# Patient Record
Sex: Female | Born: 1945
Health system: Southern US, Community
[De-identification: ages and names within clinical notes are randomized; demographics above are authoritative.]

## PROBLEM LIST (undated history)

## (undated) DIAGNOSIS — H409 Unspecified glaucoma: Secondary | ICD-10-CM

## (undated) DIAGNOSIS — I728 Aneurysm of other specified arteries: Secondary | ICD-10-CM

## (undated) HISTORY — PX: ABDOMINAL HYSTERECTOMY: SHX81

## (undated) HISTORY — DX: Unspecified glaucoma: H40.9

## (undated) HISTORY — PX: EYE SURGERY: SHX253

---

## 1999-05-09 ENCOUNTER — Encounter: Payer: Self-pay | Admitting: Family Medicine

## 1999-05-09 ENCOUNTER — Encounter: Admission: RE | Admit: 1999-05-09 | Discharge: 1999-05-09 | Payer: Self-pay | Admitting: Family Medicine

## 1999-09-26 ENCOUNTER — Other Ambulatory Visit: Admission: RE | Admit: 1999-09-26 | Discharge: 1999-09-26 | Payer: Self-pay | Admitting: Family Medicine

## 2003-07-23 ENCOUNTER — Other Ambulatory Visit: Admission: RE | Admit: 2003-07-23 | Discharge: 2003-07-23 | Payer: Self-pay | Admitting: Family Medicine

## 2003-07-28 ENCOUNTER — Ambulatory Visit (HOSPITAL_COMMUNITY): Admission: RE | Admit: 2003-07-28 | Discharge: 2003-07-28 | Payer: Self-pay | Admitting: Family Medicine

## 2003-07-30 ENCOUNTER — Encounter: Admission: RE | Admit: 2003-07-30 | Discharge: 2003-07-30 | Payer: Self-pay | Admitting: Family Medicine

## 2003-09-28 ENCOUNTER — Ambulatory Visit (HOSPITAL_COMMUNITY): Admission: RE | Admit: 2003-09-28 | Discharge: 2003-09-28 | Payer: Self-pay | Admitting: Gastroenterology

## 2006-02-23 ENCOUNTER — Ambulatory Visit (HOSPITAL_COMMUNITY): Admission: RE | Admit: 2006-02-23 | Discharge: 2006-02-23 | Payer: Self-pay | Admitting: Family Medicine

## 2007-03-20 ENCOUNTER — Ambulatory Visit (HOSPITAL_COMMUNITY): Admission: RE | Admit: 2007-03-20 | Discharge: 2007-03-20 | Payer: Self-pay | Admitting: Family Medicine

## 2007-03-25 ENCOUNTER — Encounter: Admission: RE | Admit: 2007-03-25 | Discharge: 2007-03-25 | Payer: Self-pay | Admitting: Family Medicine

## 2008-06-26 ENCOUNTER — Encounter: Admission: RE | Admit: 2008-06-26 | Discharge: 2008-06-26 | Payer: Self-pay | Admitting: Family Medicine

## 2010-02-06 ENCOUNTER — Encounter: Payer: Self-pay | Admitting: Family Medicine

## 2010-06-03 NOTE — Op Note (Signed)
NAME:  Carrie Moody, BURROWES                          ACCOUNT NO.:  [id number]   MEDICAL RECORD NO.:  [id number]                   PATIENT TYPE:  AMB   LOCATION:  ENDO                                 FACILITY:  [facility]   PHYSICIAN:  John C. Madilyn Fireman, M.D.                 DATE OF BIRTH:  Feb 18, 1945   DATE OF PROCEDURE:  09/28/2003  DATE OF DISCHARGE:                                 OPERATIVE REPORT   PROCEDURE:  Colonoscopy.   INDICATION FOR PROCEDURE:  Heme-positive stool.   DESCRIPTION OF PROCEDURE:  The patient was placed in the left lateral  decubitus position and placed on the pulse monitor with continuous low-flow  oxygen delivered by nasal cannula.  She was sedated with 100 mcg IV fentanyl  and 8 mg IV Versed.  The Olympus video colonoscope was inserted into the  rectum and advanced to the cecum, confirmed by transillumination at  McBurney's point and visualization of the ileocecal valve and appendiceal  orifice.  The prep was good.  The cecum, ascending, and transverse colon all  appeared normal with no masses, polyps, diverticula, or other mucosal  abnormalities.  Within the descending and sigmoid were a few scattered  diverticula, no other abnormalities.  The rectum appeared normal, and  retroflexed view of the anus revealed no obvious internal hemorrhoids.  The  scope was then withdrawn and the patient returned to the recovery room in  stable condition.  She tolerated the procedure well, and there were no  immediate complications.   IMPRESSION:  A few diverticula, otherwise normal study.   PLAN:  Next colon screening by sigmoidoscopy in 5 years.                                               John C. Madilyn Fireman, M.D.    J[facility]/MEDQ  D:  09/28/2003  T:  09/28/2003  Job:  [id number]   cc:   Pam Drown, M.D.  [address]  [place]  [place] [address]  Fax: [phone]

## 2011-12-06 ENCOUNTER — Other Ambulatory Visit: Payer: Self-pay | Admitting: Family Medicine

## 2011-12-06 DIAGNOSIS — M542 Cervicalgia: Secondary | ICD-10-CM

## 2011-12-06 DIAGNOSIS — H9311 Tinnitus, right ear: Secondary | ICD-10-CM

## 2011-12-18 ENCOUNTER — Ambulatory Visit
Admission: RE | Admit: 2011-12-18 | Discharge: 2011-12-18 | Disposition: A | Discharge status: Home / Self Care | Payer: BC Managed Care – PPO | Source: Ambulatory Visit | Attending: Family Medicine | Admitting: Family Medicine

## 2011-12-18 DIAGNOSIS — H9311 Tinnitus, right ear: Secondary | ICD-10-CM

## 2011-12-18 DIAGNOSIS — M542 Cervicalgia: Secondary | ICD-10-CM

## 2011-12-18 MED ORDER — GADOBENATE DIMEGLUMINE 529 MG/ML IV SOLN
9.0000 mL | Freq: Once | INTRAVENOUS | Status: AC | PRN
  Administered 2011-12-18: 9 mL via INTRAVENOUS

## 2012-05-29 ENCOUNTER — Other Ambulatory Visit: Payer: Self-pay | Admitting: Family Medicine

## 2012-05-29 DIAGNOSIS — Z1231 Encounter for screening mammogram for malignant neoplasm of breast: Secondary | ICD-10-CM

## 2012-07-17 ENCOUNTER — Ambulatory Visit: Payer: BC Managed Care – PPO

## 2012-07-18 ENCOUNTER — Ambulatory Visit: Payer: BC Managed Care – PPO

## 2012-08-01 ENCOUNTER — Ambulatory Visit: Payer: BC Managed Care – PPO

## 2012-08-19 ENCOUNTER — Ambulatory Visit: Payer: BC Managed Care – PPO

## 2012-09-23 ENCOUNTER — Ambulatory Visit
Admission: RE | Admit: 2012-09-23 | Discharge: 2012-09-23 | Disposition: A | Discharge status: Home / Self Care | Payer: Medicare Other | Source: Ambulatory Visit | Attending: Family Medicine | Admitting: Family Medicine

## 2012-09-23 DIAGNOSIS — Z1231 Encounter for screening mammogram for malignant neoplasm of breast: Secondary | ICD-10-CM

## 2013-06-25 ENCOUNTER — Other Ambulatory Visit: Payer: Self-pay | Admitting: Otolaryngology

## 2013-06-25 DIAGNOSIS — R0989 Other specified symptoms and signs involving the circulatory and respiratory systems: Secondary | ICD-10-CM

## 2013-06-25 DIAGNOSIS — R09A2 Foreign body sensation, throat: Secondary | ICD-10-CM

## 2013-06-27 ENCOUNTER — Ambulatory Visit
Admission: RE | Admit: 2013-06-27 | Discharge: 2013-06-27 | Disposition: A | Discharge status: Home / Self Care | Payer: Commercial Managed Care - HMO | Source: Ambulatory Visit | Attending: Otolaryngology | Admitting: Otolaryngology

## 2013-06-27 ENCOUNTER — Encounter (INDEPENDENT_AMBULATORY_CARE_PROVIDER_SITE_OTHER): Payer: Self-pay

## 2013-06-27 DIAGNOSIS — R0989 Other specified symptoms and signs involving the circulatory and respiratory systems: Secondary | ICD-10-CM

## 2013-06-27 DIAGNOSIS — R09A2 Foreign body sensation, throat: Secondary | ICD-10-CM

## 2014-01-23 DIAGNOSIS — L723 Sebaceous cyst: Secondary | ICD-10-CM | POA: Diagnosis not present

## 2014-01-26 DIAGNOSIS — H02834 Dermatochalasis of left upper eyelid: Secondary | ICD-10-CM | POA: Diagnosis not present

## 2014-01-26 DIAGNOSIS — H40033 Anatomical narrow angle, bilateral: Secondary | ICD-10-CM | POA: Diagnosis not present

## 2014-01-26 DIAGNOSIS — H02831 Dermatochalasis of right upper eyelid: Secondary | ICD-10-CM | POA: Diagnosis not present

## 2014-02-03 DIAGNOSIS — H40033 Anatomical narrow angle, bilateral: Secondary | ICD-10-CM | POA: Diagnosis not present

## 2014-02-24 DIAGNOSIS — H40039 Anatomical narrow angle, unspecified eye: Secondary | ICD-10-CM | POA: Diagnosis not present

## 2014-03-18 DIAGNOSIS — H02834 Dermatochalasis of left upper eyelid: Secondary | ICD-10-CM | POA: Diagnosis not present

## 2014-03-18 DIAGNOSIS — H40033 Anatomical narrow angle, bilateral: Secondary | ICD-10-CM | POA: Diagnosis not present

## 2014-03-18 DIAGNOSIS — H02831 Dermatochalasis of right upper eyelid: Secondary | ICD-10-CM | POA: Diagnosis not present

## 2014-03-18 DIAGNOSIS — H40053 Ocular hypertension, bilateral: Secondary | ICD-10-CM | POA: Diagnosis not present

## 2014-03-18 DIAGNOSIS — H2513 Age-related nuclear cataract, bilateral: Secondary | ICD-10-CM | POA: Diagnosis not present

## 2014-07-03 DIAGNOSIS — H2513 Age-related nuclear cataract, bilateral: Secondary | ICD-10-CM | POA: Diagnosis not present

## 2014-07-03 DIAGNOSIS — H02834 Dermatochalasis of left upper eyelid: Secondary | ICD-10-CM | POA: Diagnosis not present

## 2014-07-03 DIAGNOSIS — H02831 Dermatochalasis of right upper eyelid: Secondary | ICD-10-CM | POA: Diagnosis not present

## 2014-07-03 DIAGNOSIS — H40033 Anatomical narrow angle, bilateral: Secondary | ICD-10-CM | POA: Diagnosis not present

## 2014-07-03 DIAGNOSIS — H40053 Ocular hypertension, bilateral: Secondary | ICD-10-CM | POA: Diagnosis not present

## 2014-07-14 DIAGNOSIS — G479 Sleep disorder, unspecified: Secondary | ICD-10-CM | POA: Diagnosis not present

## 2014-07-14 DIAGNOSIS — E559 Vitamin D deficiency, unspecified: Secondary | ICD-10-CM | POA: Diagnosis not present

## 2014-07-14 DIAGNOSIS — E785 Hyperlipidemia, unspecified: Secondary | ICD-10-CM | POA: Diagnosis not present

## 2014-07-14 DIAGNOSIS — H02409 Unspecified ptosis of unspecified eyelid: Secondary | ICD-10-CM | POA: Diagnosis not present

## 2014-07-14 DIAGNOSIS — F331 Major depressive disorder, recurrent, moderate: Secondary | ICD-10-CM | POA: Diagnosis not present

## 2014-07-14 DIAGNOSIS — Z0000 Encounter for general adult medical examination without abnormal findings: Secondary | ICD-10-CM | POA: Diagnosis not present

## 2014-07-14 DIAGNOSIS — M85852 Other specified disorders of bone density and structure, left thigh: Secondary | ICD-10-CM | POA: Diagnosis not present

## 2014-07-14 DIAGNOSIS — H40009 Preglaucoma, unspecified, unspecified eye: Secondary | ICD-10-CM | POA: Diagnosis not present

## 2014-08-19 DIAGNOSIS — Z719 Counseling, unspecified: Secondary | ICD-10-CM | POA: Diagnosis not present

## 2014-08-19 DIAGNOSIS — H02836 Dermatochalasis of left eye, unspecified eyelid: Secondary | ICD-10-CM | POA: Diagnosis not present

## 2014-08-19 DIAGNOSIS — H02833 Dermatochalasis of right eye, unspecified eyelid: Secondary | ICD-10-CM | POA: Diagnosis not present

## 2014-10-28 DIAGNOSIS — L821 Other seborrheic keratosis: Secondary | ICD-10-CM | POA: Diagnosis not present

## 2014-10-28 DIAGNOSIS — D1801 Hemangioma of skin and subcutaneous tissue: Secondary | ICD-10-CM | POA: Diagnosis not present

## 2014-10-28 DIAGNOSIS — D225 Melanocytic nevi of trunk: Secondary | ICD-10-CM | POA: Diagnosis not present

## 2014-10-28 DIAGNOSIS — D2261 Melanocytic nevi of right upper limb, including shoulder: Secondary | ICD-10-CM | POA: Diagnosis not present

## 2015-01-17 HISTORY — PX: IRIDOTOMY / IRIDECTOMY: SHX165

## 2015-05-11 DIAGNOSIS — H02834 Dermatochalasis of left upper eyelid: Secondary | ICD-10-CM | POA: Diagnosis not present

## 2015-05-11 DIAGNOSIS — H40053 Ocular hypertension, bilateral: Secondary | ICD-10-CM | POA: Diagnosis not present

## 2015-05-11 DIAGNOSIS — H02831 Dermatochalasis of right upper eyelid: Secondary | ICD-10-CM | POA: Diagnosis not present

## 2015-05-11 DIAGNOSIS — H40033 Anatomical narrow angle, bilateral: Secondary | ICD-10-CM | POA: Diagnosis not present

## 2015-05-11 DIAGNOSIS — H2513 Age-related nuclear cataract, bilateral: Secondary | ICD-10-CM | POA: Diagnosis not present

## 2015-05-13 ENCOUNTER — Other Ambulatory Visit: Payer: Self-pay

## 2015-05-13 DIAGNOSIS — Z1231 Encounter for screening mammogram for malignant neoplasm of breast: Secondary | ICD-10-CM

## 2015-06-23 ENCOUNTER — Ambulatory Visit
Admission: RE | Admit: 2015-06-23 | Discharge: 2015-06-23 | Disposition: A | Discharge status: Home / Self Care | Payer: Commercial Managed Care - HMO | Source: Ambulatory Visit

## 2015-06-23 ENCOUNTER — Other Ambulatory Visit: Payer: Self-pay | Admitting: Family Medicine

## 2015-06-23 DIAGNOSIS — Z1231 Encounter for screening mammogram for malignant neoplasm of breast: Secondary | ICD-10-CM

## 2015-07-12 DIAGNOSIS — H02831 Dermatochalasis of right upper eyelid: Secondary | ICD-10-CM | POA: Diagnosis not present

## 2015-07-12 DIAGNOSIS — H40053 Ocular hypertension, bilateral: Secondary | ICD-10-CM | POA: Diagnosis not present

## 2015-07-12 DIAGNOSIS — H40033 Anatomical narrow angle, bilateral: Secondary | ICD-10-CM | POA: Diagnosis not present

## 2015-07-12 DIAGNOSIS — H02834 Dermatochalasis of left upper eyelid: Secondary | ICD-10-CM | POA: Diagnosis not present

## 2015-07-12 DIAGNOSIS — H2513 Age-related nuclear cataract, bilateral: Secondary | ICD-10-CM | POA: Diagnosis not present

## 2015-07-13 DIAGNOSIS — Z1211 Encounter for screening for malignant neoplasm of colon: Secondary | ICD-10-CM | POA: Diagnosis not present

## 2015-07-13 DIAGNOSIS — K5730 Diverticulosis of large intestine without perforation or abscess without bleeding: Secondary | ICD-10-CM | POA: Diagnosis not present

## 2015-07-29 ENCOUNTER — Ambulatory Visit
Admission: RE | Admit: 2015-07-29 | Discharge: 2015-07-29 | Disposition: A | Discharge status: Home / Self Care | Payer: Commercial Managed Care - HMO | Source: Ambulatory Visit | Attending: Gastroenterology | Admitting: Gastroenterology

## 2015-07-29 ENCOUNTER — Other Ambulatory Visit: Payer: Self-pay | Admitting: Gastroenterology

## 2015-07-29 DIAGNOSIS — R1032 Left lower quadrant pain: Secondary | ICD-10-CM

## 2015-07-29 DIAGNOSIS — E785 Hyperlipidemia, unspecified: Secondary | ICD-10-CM | POA: Diagnosis not present

## 2015-07-29 DIAGNOSIS — M85852 Other specified disorders of bone density and structure, left thigh: Secondary | ICD-10-CM | POA: Diagnosis not present

## 2015-07-29 DIAGNOSIS — G479 Sleep disorder, unspecified: Secondary | ICD-10-CM | POA: Diagnosis not present

## 2015-07-29 DIAGNOSIS — E559 Vitamin D deficiency, unspecified: Secondary | ICD-10-CM | POA: Diagnosis not present

## 2015-07-29 DIAGNOSIS — Z1389 Encounter for screening for other disorder: Secondary | ICD-10-CM | POA: Diagnosis not present

## 2015-07-29 DIAGNOSIS — Z0001 Encounter for general adult medical examination with abnormal findings: Secondary | ICD-10-CM | POA: Diagnosis not present

## 2015-07-29 DIAGNOSIS — K5730 Diverticulosis of large intestine without perforation or abscess without bleeding: Secondary | ICD-10-CM | POA: Diagnosis not present

## 2015-07-29 MED ORDER — IOPAMIDOL (ISOVUE-300) INJECTION 61%
100.0000 mL | Freq: Once | INTRAVENOUS | Status: AC | PRN
  Administered 2015-07-29: 100 mL via INTRAVENOUS

## 2015-07-30 ENCOUNTER — Other Ambulatory Visit: Payer: Commercial Managed Care - HMO

## 2015-08-03 DIAGNOSIS — R1032 Left lower quadrant pain: Secondary | ICD-10-CM | POA: Diagnosis not present

## 2015-08-03 DIAGNOSIS — I728 Aneurysm of other specified arteries: Secondary | ICD-10-CM | POA: Diagnosis not present

## 2015-08-17 ENCOUNTER — Encounter (HOSPITAL_COMMUNITY): Payer: Self-pay | Admitting: Emergency Medicine

## 2015-08-17 ENCOUNTER — Emergency Department (HOSPITAL_COMMUNITY): Payer: Commercial Managed Care - HMO

## 2015-08-17 ENCOUNTER — Emergency Department (HOSPITAL_COMMUNITY)
Admission: EM | Admit: 2015-08-17 | Discharge: 2015-08-17 | Disposition: A | Discharge status: Home / Self Care | Payer: Commercial Managed Care - HMO | Attending: Emergency Medicine | Admitting: Emergency Medicine

## 2015-08-17 DIAGNOSIS — R509 Fever, unspecified: Secondary | ICD-10-CM | POA: Diagnosis not present

## 2015-08-17 DIAGNOSIS — M549 Dorsalgia, unspecified: Secondary | ICD-10-CM | POA: Insufficient documentation

## 2015-08-17 DIAGNOSIS — Z79899 Other long term (current) drug therapy: Secondary | ICD-10-CM | POA: Diagnosis not present

## 2015-08-17 DIAGNOSIS — R1032 Left lower quadrant pain: Secondary | ICD-10-CM | POA: Diagnosis not present

## 2015-08-17 DIAGNOSIS — R112 Nausea with vomiting, unspecified: Secondary | ICD-10-CM | POA: Diagnosis not present

## 2015-08-17 DIAGNOSIS — K5732 Diverticulitis of large intestine without perforation or abscess without bleeding: Secondary | ICD-10-CM | POA: Diagnosis not present

## 2015-08-17 HISTORY — DX: Aneurysm of other specified arteries: I72.8

## 2015-08-17 LAB — COMPREHENSIVE METABOLIC PANEL
ALBUMIN: 4.6 g/dL (ref 3.5–5.0)
ALT: 32 U/L (ref 14–54)
ANION GAP: 9 (ref 5–15)
AST: 38 U/L (ref 15–41)
Alkaline Phosphatase: 65 U/L (ref 38–126)
BILIRUBIN TOTAL: 1.1 mg/dL (ref 0.3–1.2)
BUN: 8 mg/dL (ref 6–20)
CO2: 23 mmol/L (ref 22–32)
Calcium: 9 mg/dL (ref 8.9–10.3)
Chloride: 104 mmol/L (ref 101–111)
Creatinine, Ser: 0.73 mg/dL (ref 0.44–1.00)
GFR calc non Af Amer: >60 mL/min (ref >60)
GLUCOSE: 133 mg/dL — AB (ref 65–99)
POTASSIUM: 3.7 mmol/L (ref 3.5–5.1)
SODIUM: 136 mmol/L (ref 135–145)
TOTAL PROTEIN: 7.5 g/dL (ref 6.5–8.1)

## 2015-08-17 LAB — CBC
HEMATOCRIT: 40.8 % (ref 36.0–46.0)
HEMOGLOBIN: 13.6 g/dL (ref 12.0–15.0)
MCH: 29.6 pg (ref 26.0–34.0)
MCHC: 33.3 g/dL (ref 30.0–36.0)
MCV: 88.7 fL (ref 78.0–100.0)
Platelets: 215 10*3/uL (ref 150–400)
RBC: 4.6 MIL/uL (ref 3.87–5.11)
RDW: 12.9 % (ref 11.5–15.5)
WBC: 11.1 10*3/uL — ABNORMAL HIGH (ref 4.0–10.5)

## 2015-08-17 LAB — I-STAT CG4 LACTIC ACID, ED: LACTIC ACID, VENOUS: 1.32 mmol/L (ref 0.5–1.9)

## 2015-08-17 LAB — URINALYSIS, ROUTINE W REFLEX MICROSCOPIC
BILIRUBIN URINE: NEGATIVE
Glucose, UA: NEGATIVE mg/dL
Ketones, ur: NEGATIVE mg/dL
Leukocytes, UA: NEGATIVE
NITRITE: NEGATIVE
PH: 5 (ref 5.0–8.0)
Protein, ur: NEGATIVE mg/dL
SPECIFIC GRAVITY, URINE: 1.012 (ref 1.005–1.030)

## 2015-08-17 LAB — URINE MICROSCOPIC-ADD ON

## 2015-08-17 LAB — LIPASE, BLOOD: Lipase: 46 U/L (ref 11–51)

## 2015-08-17 MED ORDER — IOPAMIDOL (ISOVUE-300) INJECTION 61%
100.0000 mL | Freq: Once | INTRAVENOUS | Status: AC | PRN
  Administered 2015-08-17: 100 mL via INTRAVENOUS

## 2015-08-17 MED ORDER — DIATRIZOATE MEGLUMINE & SODIUM 66-10 % PO SOLN
15.0000 mL | Freq: Once | ORAL | Status: AC
  Administered 2015-08-17: 15 mL via ORAL

## 2015-08-17 MED ORDER — ACETAMINOPHEN 325 MG PO TABS
650.0000 mg | ORAL_TABLET | Freq: Once | ORAL | Status: AC
  Administered 2015-08-17: 650 mg via ORAL
  Filled 2015-08-17: qty 2

## 2015-08-17 MED ORDER — SODIUM CHLORIDE 0.9 % IV BOLUS (SEPSIS)
1000.0000 mL | Freq: Once | INTRAVENOUS | Status: AC
  Administered 2015-08-17: 1000 mL via INTRAVENOUS

## 2015-08-17 MED ORDER — ACETAMINOPHEN 500 MG PO TABS
1000.0000 mg | ORAL_TABLET | Freq: Once | ORAL | Status: AC
  Administered 2015-08-17: 1000 mg via ORAL
  Filled 2015-08-17: qty 2

## 2015-08-17 NOTE — ED Notes (Signed)
PT DISCHARGED. INSTRUCTIONS GIVEN. AAOX4. PT IN NO APPARENT DISTRESS OR PAIN. THE OPPORTUNITY TO ASK QUESTIONS WAS PROVIDED. 

## 2015-08-17 NOTE — ED Triage Notes (Addendum)
patient c/o pain in abd and back that has been going on. But today started having n/v, chills, shaking and fever.  Patient states that in July found splenic aorta aneurysm.  Patient denies taking anything for fever today.  Pt recently on 3 antibiotics for diverticulitis.

## 2015-08-17 NOTE — ED Provider Notes (Signed)
[facility] DEPT Provider Note   CSN: [id number] Arrival date & time: 08/17/15  1418  First Provider Contact:  None       History   Chief Complaint Chief Complaint  Patient presents with  . Abdominal Pain  . Back Pain  . Emesis  . Chills  . Fever    HPI Carrie Moody is a 70 y.o. female.  Patient presents with abdominal back pain. She states she's had persistent discomfort in her left lower abdomen for about 5 weeks. She had a colonoscopy done by Dr. Amedeo Plenty.  She was told during this colonoscopy that she had evidence of diverticulitis. Apparently she had inflammation of the diverticula with purulent discharge. She took a 10 day course of Cipro and Flagyl. When she followed up with her PCP, she had some blood in her urine and ongoing abdominal pain. They were concerned about a possible perforation and she had a CT scan done on July 13th. This did not show any evidence of diverticulitis. There was diverticulosis and incidental finding of a splenic artery aneurysm. She has an appointment to follow-up with Dr. early with vascular surgery on August 15. She presents today because she had onset of severe pain in her left mid back. She also has ongoing pain in her left lower abdomen. She developed a fever to 103 with associated nausea and vomiting. She had severe chills. She denies any urinary symptoms. No hematuria. No diarrhea. No change in her bowel habits. No blood in her stool. No URI symptoms. No cough or chest congestion.    Abdominal Pain   Associated symptoms include fever, nausea and vomiting. Pertinent negatives include diarrhea, frequency, hematuria, headaches and arthralgias.  Back Pain    Emesis   Associated symptoms include vomiting. Pertinent negatives include no chest pain, no diarrhea, no congestion, no headaches and no cough.  Fever   Associated symptoms include vomiting. Pertinent negatives include no numbness and no weakness.    Past Medical History:  Diagnosis  Date  . Splenic artery aneurysm (HCC)     There are no active problems to display for this patient.   Past Surgical History:  Procedure Laterality Date  . ABDOMINAL HYSTERECTOMY      OB History    No data available       Home Medications    Prior to Admission medications   Medication Sig Start Date End Date Taking? Authorizing Provider  LUMIGAN 0.01 % SOLN Place 1 drop into both eyes at bedtime.  06/08/15  Yes Historical Provider, MD  zolpidem (AMBIEN) 10 MG tablet Take 5 mg by mouth at bedtime as needed for sleep.  07/29/15  Yes Historical Provider, MD  amoxicillin-clavulanate (AUGMENTIN) 875-125 MG tablet Take 1 tablet by mouth 2 (two) times daily. For 7 days, starting 08/04/15. 08/04/15   Historical Provider, MD  ciprofloxacin (CIPRO) 500 MG tablet Take 500 mg by mouth 2 (two) times daily. For 5 days, starting 07/29/15. 07/29/15   Historical Provider, MD  metroNIDAZOLE (FLAGYL) 500 MG tablet Take 500 mg by mouth 2 (two) times daily. For 5 days, starting 07/29/15. 07/29/15   Historical Provider, MD  Vitamin D, Ergocalciferol, (DRISDOL) 50000 units CAPS capsule Take 50,000 Units by mouth every 7 (seven) days. 07/31/15   Historical Provider, MD    Family History No family history on file.  Social History Social History  Substance Use Topics  . Smoking status: Never Smoker  . Smokeless tobacco: Never Used  . Alcohol use Yes  Comment: occasional     Allergies   Codeine   Review of Systems Review of Systems  Constitutional: Positive for fever. Negative for chills, diaphoresis and fatigue.  HENT: Negative for congestion, rhinorrhea and sneezing.   Eyes: Negative.   Respiratory: Negative for cough, chest tightness and shortness of breath.   Cardiovascular: Negative for chest pain and leg swelling.  Gastrointestinal: Positive for abdominal pain, nausea and vomiting. Negative for blood in stool and diarrhea.  Genitourinary: Negative for difficulty urinating, flank pain,  frequency and hematuria.  Musculoskeletal: Positive for back pain. Negative for arthralgias.  Skin: Negative for rash.  Neurological: Negative for dizziness, speech difficulty, weakness, numbness and headaches.     Physical Exam Updated Vital Signs BP 112/55 (BP Location: Right Arm)   Pulse 105   Temp 99.8 F (37.7 C) (Oral)   Resp 14   Ht 5\' 3"  (1.6 m)   Wt 116 lb (52.6 kg)   SpO2 100%   BMI 20.55 kg/m   Physical Exam  Constitutional: She is oriented to person, place, and time. She appears well-developed and well-nourished.  HENT:  Head: Normocephalic and atraumatic.  Eyes: Pupils are equal, round, and reactive to light.  Neck: Normal range of motion. Neck supple.  Cardiovascular: Normal rate, regular rhythm and normal heart sounds.   Pulmonary/Chest: Effort normal and breath sounds normal. No respiratory distress. She has no wheezes. She has no rales. She exhibits no tenderness.  Abdominal: Soft. Bowel sounds are normal. There is tenderness (moderate TTP LLQ, mild left CVA tenderness). There is no rebound and no guarding.  Musculoskeletal: Normal range of motion. She exhibits no edema.  Lymphadenopathy:    She has no cervical adenopathy.  Neurological: She is alert and oriented to person, place, and time.  Skin: Skin is warm and dry. No rash noted.  Psychiatric: She has a normal mood and affect.     ED Treatments / Results  Labs (all labs ordered are listed, but only abnormal results are displayed) Labs Reviewed  COMPREHENSIVE METABOLIC PANEL - Abnormal; Notable for the following:       Result Value   Glucose, Bld 133 (*)    All other components within normal limits  CBC - Abnormal; Notable for the following:    WBC 11.1 (*)    All other components within normal limits  URINALYSIS, ROUTINE W REFLEX MICROSCOPIC (NOT AT [facility]) - Abnormal; Notable for the following:    Hgb urine dipstick SMALL (*)    All other components within normal limits  URINE MICROSCOPIC-ADD ON  - Abnormal; Notable for the following:    Squamous Epithelial / LPF 0-5 (*)    Bacteria, UA RARE (*)    All other components within normal limits  CULTURE, BLOOD (ROUTINE X 2)  CULTURE, BLOOD (ROUTINE X 2)  LIPASE, BLOOD  I-STAT CG4 LACTIC ACID, ED    EKG  EKG Interpretation  Date/Time:  Tuesday August 17 2015 16:50:59 EDT Ventricular Rate:  106 PR Interval:    QRS Duration: 93 QT Interval:  328 QTC Calculation: 436 R Axis:   56 Text Interpretation:  Sinus tachycardia RSR' in V1 or V2, probably normal variant Baseline wander in lead(s) V3 No old tracing to compare Confirmed by Shikita Vaillancourt  MD, Katlynn Naser ([id number]) on 08/17/2015 6:24:45 PM       Radiology Dg Chest 2 View  Result Date: 08/17/2015 CLINICAL DATA:  Fever, abdominal pain, nausea and vomiting today. EXAM: CHEST  2 VIEW COMPARISON:  Chest x-ray dated  03/06/2010. FINDINGS: Heart and mediastinal contours are normal. Lungs are clear. No evidence of pneumonia. No pleural effusion or pneumothorax seen. Osseous structures about the chest are unremarkable. IMPRESSION: No active cardiopulmonary disease. Electronically Signed   By: Franki Cabot M.D.   On: 08/17/2015 19:35   Ct Abdomen Pelvis W Contrast  Result Date: 08/17/2015 CLINICAL DATA:  Recent diverticulitis, worsening abdominal pain and fever. Pain in the abdomen and back. Nausea, vomiting, chills, shaking, and fever. Splenic aneurysm noted in July. EXAM: CT ABDOMEN AND PELVIS WITH CONTRAST TECHNIQUE: Multidetector CT imaging of the abdomen and pelvis was performed using the standard protocol following bolus administration of intravenous contrast. CONTRAST:  142mL ISOVUE-300 IOPAMIDOL (ISOVUE-300) INJECTION 61% COMPARISON:  07/29/2015 CT FINDINGS: Lower chest: No pulmonary nodules, pleural effusions, or infiltrates. Heart size is normal. No imaged pericardial effusion or significant coronary artery calcifications. Hepatobiliary: No focal liver abnormality identified. Gallbladder is present.  Pancreas: Normal in appearance. Spleen: Normal in appearance. Stable appearance of splenic artery aneurysm, 1.7 centimeters maximum diameter. Renal/Adrenal: The adrenal glands are normal. There is symmetric enhancement and excretion from both kidneys. No focal renal mass or hydronephrosis. Gastrointestinal tract: The stomach and small bowel loops are normal in appearance. Extensive diverticular disease throughout the colon, particularly involving the sigmoid. No acute inflammatory changes are present indicate acute diverticulitis. No abscess or free intraperitoneal air. Appendix is not seen. Reproductive/Pelvis: The uterus is surgically absent. No adnexal mass. Vascular/Lymphatic: No evidence for aortic aneurysm. No retroperitoneal or mesenteric adenopathy. Musculoskeletal/Abdominal wall: Abdominal wall is unremarkable. Visualized osseous structures have a normal appearance. Other: none IMPRESSION: 1. Significant diverticulosis without evidence for acute diverticulitis. 2. No abscess or bowel obstruction. 3. Stable appearance of splenic artery aneurysm. Annual cross-sectional surveillance is recommended. Electronically Signed   By: Nolon Nations M.D.   On: 08/17/2015 17:46    Procedures Procedures (including critical care time)  Medications Ordered in ED Medications  sodium chloride 0.9 % bolus 1,000 mL (1,000 mLs Intravenous New Bag/Given 08/17/15 1939)  acetaminophen (TYLENOL) tablet 1,000 mg (not administered)  acetaminophen (TYLENOL) tablet 650 mg (650 mg Oral Given 08/17/15 1435)  sodium chloride 0.9 % bolus 1,000 mL (0 mLs Intravenous Stopped 08/17/15 1939)  iopamidol (ISOVUE-300) 61 % injection 100 mL (100 mLs Intravenous Contrast Given 08/17/15 1724)  diatrizoate meglumine-sodium (GASTROGRAFIN) 66-10 % solution 15 mL (15 mLs Oral Given 08/17/15 1630)     Initial Impression / Assessment and Plan / ED Course  I have reviewed the triage vital signs and the nursing notes.  Pertinent labs & imaging  results that were available during my care of the patient were reviewed by me and considered in my medical decision making (see chart for details).  Clinical Course    Patient presents with fever and chills. She had one episode of vomiting earlier today but no vomiting since that time. She has pain to her left lower abdomen which is been going on for 5 weeks and is unchanged. She does have some new pain in her right upper back. She was treated here with IV fluids and antipyretics. She's feeling much better. Her back pain is completely resolved. Her urine is clean without evidence of infection. There is still some microscopic hematuria. I did a CT scan of her abdomen pelvis which does not show any acute infection. Chest x-ray is clear without evidence of infection. She has no symptoms that would be more consistent with meningitis. Her lactate is normal. Her heart rate is improved from the 120s  to the low 100s. She states overall she is feeling much better. There is no other markers for sepsis. She's had no hypotension. Blood cultures are drawn. She was discharged home in good condition. She will follow-up with her PCP tomorrow if she's not feeling any better. She was advised to return here if she has any worsening symptoms. She seems to be very reliable and has transportation.  Final Clinical Impressions(s) / ED Diagnoses   Final diagnoses:  Febrile illness    New Prescriptions New Prescriptions   No medications on file     Malvin Johns, MD 08/17/15 2038

## 2015-08-21 ENCOUNTER — Telehealth (HOSPITAL_BASED_OUTPATIENT_CLINIC_OR_DEPARTMENT_OTHER): Payer: Self-pay

## 2015-08-23 LAB — CULTURE, BLOOD (ROUTINE X 2)
CULTURE: NO GROWTH
Culture: NO GROWTH

## 2015-08-25 ENCOUNTER — Encounter: Payer: Commercial Managed Care - HMO | Admitting: Vascular Surgery

## 2015-08-26 ENCOUNTER — Encounter: Payer: Self-pay | Admitting: Vascular Surgery

## 2015-08-30 DIAGNOSIS — R351 Nocturia: Secondary | ICD-10-CM | POA: Diagnosis not present

## 2015-08-30 DIAGNOSIS — R3121 Asymptomatic microscopic hematuria: Secondary | ICD-10-CM | POA: Diagnosis not present

## 2015-08-30 DIAGNOSIS — R350 Frequency of micturition: Secondary | ICD-10-CM | POA: Diagnosis not present

## 2015-08-31 ENCOUNTER — Ambulatory Visit (INDEPENDENT_AMBULATORY_CARE_PROVIDER_SITE_OTHER): Payer: Commercial Managed Care - HMO | Admitting: Vascular Surgery

## 2015-08-31 ENCOUNTER — Encounter: Payer: Self-pay | Admitting: Vascular Surgery

## 2015-08-31 VITALS — BP 119/68 | HR 87 | Ht 63.0 in | Wt 114.8 lb

## 2015-08-31 DIAGNOSIS — I728 Aneurysm of other specified arteries: Secondary | ICD-10-CM | POA: Diagnosis not present

## 2015-08-31 NOTE — Progress Notes (Signed)
Patient name: Carrie Moody MRN: [id number] DOB: 1945/08/16 Sex: female  REASON FOR CONSULT: Valuation recent diagnosis of splenic artery aneurysm  HPI: Carrie Moody is a 70 y.o. female, is today for discussion of recent discovery of a 1.8 cm splenic artery aneurysm seen on CT scan. He is here today with her husband. Initially presented in June with severe pain in her left lower quadrant. Colonoscopy revealed diverticulitis she was started on Flagyl and Cipro. She had persistent pain and underwent CT scan on 07/29/2015. This did not show any evidence of colon perforation but did show unsuspected 1.8 cm clinic artery aneurysm. She is continue to have some discomfort in her left lower quadrant and it continues to come and go. It is sometimes on a pain level of 2-3 and can be is much as 78. Does radiate around her flank towards her back on occasion. She reports still an area that is low in her left lower quadrant. Does not appear to be related to bowel movements. She had an episode August 1 of attempt 102 with more pain and more severe abdominal and back pain with vomiting and chills. She presented to emergency room on 81 and underwent repeat CT scan showing no change in the splenic artery aneurysm and no new findings. Since then she's had a workup of the XCOPY hematuria with no evidence of abnormality in her bladder. She continues to have the left lower quadrant discomfort. She does have a history of abdominal aortic aneurysm in his father. He did not have a treatment of this prior to his death. Was very healthy and very active  Past Medical History:  Diagnosis Date  . Splenic artery aneurysm (HCC)     Family History  Problem Relation Age of Onset  . AAA (abdominal aortic aneurysm) Father     SOCIAL HISTORY: Social History   Social History  . Marital status: Married    Spouse name: N/A  . Number of children: N/A  . Years of education: N/A   Occupational History  . Not on file.    Social History Main Topics  . Smoking status: Never Smoker  . Smokeless tobacco: Never Used  . Alcohol use Yes     Comment: occasional  . Drug use: Unknown  . Sexual activity: Not on file   Other Topics Concern  . Not on file   Social History Narrative  . No narrative on file    Allergies  Allergen Reactions  . Codeine Other (See Comments)    Patient doesn't recall exact reaction; remembers being "wired" after taking it.    Current Outpatient Prescriptions  Medication Sig Dispense Refill  . LUMIGAN 0.01 % SOLN Place 1 drop into both eyes at bedtime.     . Vitamin D, Ergocalciferol, (DRISDOL) 50000 units CAPS capsule Take 50,000 Units by mouth every 7 (seven) days.    Marland Kitchen zolpidem (AMBIEN) 10 MG tablet Take 5 mg by mouth at bedtime as needed for sleep.     Marland Kitchen amoxicillin-clavulanate (AUGMENTIN) 875-125 MG tablet Take 1 tablet by mouth 2 (two) times daily. For 7 days, starting 08/04/15.    . ciprofloxacin (CIPRO) 500 MG tablet Take 500 mg by mouth 2 (two) times daily. For 5 days, starting 07/29/15.    Marland Kitchen metroNIDAZOLE (FLAGYL) 500 MG tablet Take 500 mg by mouth 2 (two) times daily. For 5 days, starting 07/29/15.     No current facility-administered medications for this visit.     REVIEW OF  SYSTEMS:  [X]  denotes positive finding, [ ]  denotes negative finding Cardiac  Comments:  Chest pain or chest pressure:    Shortness of breath upon exertion:    Short of breath when lying flat:    Irregular heart rhythm:        Vascular    Pain in calf, thigh, or hip brought on by ambulation:    Pain in feet at night that wakes you up from your sleep:     Blood clot in your veins:    Leg swelling:         Pulmonary    Oxygen at home:    Productive cough:     Wheezing:         Neurologic    Sudden weakness in arms or legs:     Sudden numbness in arms or legs:     Sudden onset of difficulty speaking or slurred speech:    Temporary loss of vision in one eye:     Problems with  dizziness:         Gastrointestinal    Blood in stool:     Vomited blood:         Genitourinary    Burning when urinating:     Blood in urine: x       Psychiatric    Major depression:         Hematologic    Bleeding problems:    Problems with blood clotting too easily:        Skin    Rashes or ulcers:        Constitutional    Fever or chills: x     PHYSICAL EXAM: Vitals:   08/31/15 1424  BP: 119/68  Pulse: 87  SpO2: 98%  Weight: 114 lb 12.8 oz (52.1 kg)  Height: 5\' 3"  (1.6 m)    GENERAL: The patient is a well-nourished female, in no acute distress. The vital signs are documented above. CARDIAC: There is a regular rate and rhythm.  VASCULAR: 2+ radial 2+ femoral and 2+ dorsalis pedis pulses bilaterally. Carotid arteries without bruits bilaterally. PULMONARY: There is good air exchange bilaterally without wheezing or rales. ABDOMEN: Soft and non-tender with normal pitched bowel sounds. No bruit noted and no aneurysm palpable MUSCULOSKELETAL: There are no major deformities or cyanosis. NEUROLOGIC: No focal weakness or paresthesias are detected. SKIN: There are no ulcers or rashes noted. PSYCHIATRIC: The patient has a normal affect.  DATA:   Reviewed her CT scans and reviewed the actual images with the patient and her husband present. This does show 1.8 cm splenic artery aneurysm which is quite calcified.  MEDICAL ISSUES:  Asymptomatic incidental finding of 1.8 cm calcified splenic artery aneurysm. Explained that this undoubtedly is not causing her any symptoms. Explained the natural history is these typically remained stable but can show slow growth over time. Lane symptoms of the ruptured splenic artery aneurysm but again explained this would be highly unlikely. I suggested no restriction of her activities or other medical treatments that may be required related to this. We will see her again in one year with repeat CT scan to rule out any change of her asymptomatic  splenic artery aneurysm. Discussed options for repair if she should have enlargement of her aneurysm. They were comfortable with this discussion will see me again in one year   Arlinda Barcelona [facility] [phone]

## 2015-09-02 ENCOUNTER — Encounter: Payer: Self-pay | Admitting: Gastroenterology

## 2015-09-07 ENCOUNTER — Other Ambulatory Visit: Payer: Self-pay | Admitting: *Deleted

## 2015-09-07 DIAGNOSIS — I728 Aneurysm of other specified arteries: Secondary | ICD-10-CM

## 2015-09-14 DIAGNOSIS — R1032 Left lower quadrant pain: Secondary | ICD-10-CM | POA: Diagnosis not present

## 2015-09-14 DIAGNOSIS — L908 Other atrophic disorders of skin: Secondary | ICD-10-CM | POA: Diagnosis not present

## 2015-12-15 DIAGNOSIS — B370 Candidal stomatitis: Secondary | ICD-10-CM | POA: Diagnosis not present

## 2015-12-20 DIAGNOSIS — K1230 Oral mucositis (ulcerative), unspecified: Secondary | ICD-10-CM | POA: Diagnosis not present

## 2015-12-23 DIAGNOSIS — B370 Candidal stomatitis: Secondary | ICD-10-CM | POA: Diagnosis not present

## 2016-04-25 DIAGNOSIS — H16223 Keratoconjunctivitis sicca, not specified as Sjogren's, bilateral: Secondary | ICD-10-CM | POA: Diagnosis not present

## 2016-04-25 DIAGNOSIS — H02831 Dermatochalasis of right upper eyelid: Secondary | ICD-10-CM | POA: Diagnosis not present

## 2016-04-25 DIAGNOSIS — H2513 Age-related nuclear cataract, bilateral: Secondary | ICD-10-CM | POA: Diagnosis not present

## 2016-04-25 DIAGNOSIS — H40033 Anatomical narrow angle, bilateral: Secondary | ICD-10-CM | POA: Diagnosis not present

## 2016-04-25 DIAGNOSIS — H02834 Dermatochalasis of left upper eyelid: Secondary | ICD-10-CM | POA: Diagnosis not present

## 2016-04-25 DIAGNOSIS — H40053 Ocular hypertension, bilateral: Secondary | ICD-10-CM | POA: Diagnosis not present

## 2016-06-06 ENCOUNTER — Telehealth: Payer: Self-pay | Admitting: Vascular Surgery

## 2016-06-06 NOTE — Telephone Encounter (Signed)
Spoke with patient regarding her upcoming 1 year followup appt w/ [facility] on 09/05/16 at 4pm. He also requested a CTA prior and that is scheduled for 08/29/16 at 11am. The patient is to arrive at 10:30am at the [address] location of [facility] and no solid foods 4 hours prior. I will mail her the above information as well. awt

## 2016-06-07 DIAGNOSIS — L570 Actinic keratosis: Secondary | ICD-10-CM | POA: Diagnosis not present

## 2016-06-07 DIAGNOSIS — D2261 Melanocytic nevi of right upper limb, including shoulder: Secondary | ICD-10-CM | POA: Diagnosis not present

## 2016-06-07 DIAGNOSIS — L821 Other seborrheic keratosis: Secondary | ICD-10-CM | POA: Diagnosis not present

## 2016-06-07 DIAGNOSIS — D225 Melanocytic nevi of trunk: Secondary | ICD-10-CM | POA: Diagnosis not present

## 2016-06-07 DIAGNOSIS — L814 Other melanin hyperpigmentation: Secondary | ICD-10-CM | POA: Diagnosis not present

## 2016-06-07 DIAGNOSIS — D1801 Hemangioma of skin and subcutaneous tissue: Secondary | ICD-10-CM | POA: Diagnosis not present

## 2016-08-15 DIAGNOSIS — G479 Sleep disorder, unspecified: Secondary | ICD-10-CM | POA: Diagnosis not present

## 2016-08-15 DIAGNOSIS — Z124 Encounter for screening for malignant neoplasm of cervix: Secondary | ICD-10-CM | POA: Diagnosis not present

## 2016-08-15 DIAGNOSIS — M85852 Other specified disorders of bone density and structure, left thigh: Secondary | ICD-10-CM | POA: Diagnosis not present

## 2016-08-15 DIAGNOSIS — Z1159 Encounter for screening for other viral diseases: Secondary | ICD-10-CM | POA: Diagnosis not present

## 2016-08-15 DIAGNOSIS — E440 Moderate protein-calorie malnutrition: Secondary | ICD-10-CM | POA: Diagnosis not present

## 2016-08-15 DIAGNOSIS — Z0000 Encounter for general adult medical examination without abnormal findings: Secondary | ICD-10-CM | POA: Diagnosis not present

## 2016-08-15 DIAGNOSIS — R7301 Impaired fasting glucose: Secondary | ICD-10-CM | POA: Diagnosis not present

## 2016-08-15 DIAGNOSIS — Z1389 Encounter for screening for other disorder: Secondary | ICD-10-CM | POA: Diagnosis not present

## 2016-08-15 DIAGNOSIS — H40009 Preglaucoma, unspecified, unspecified eye: Secondary | ICD-10-CM | POA: Diagnosis not present

## 2016-08-15 DIAGNOSIS — E559 Vitamin D deficiency, unspecified: Secondary | ICD-10-CM | POA: Diagnosis not present

## 2016-08-18 DIAGNOSIS — Z7289 Other problems related to lifestyle: Secondary | ICD-10-CM | POA: Diagnosis not present

## 2016-08-18 DIAGNOSIS — H6123 Impacted cerumen, bilateral: Secondary | ICD-10-CM | POA: Diagnosis not present

## 2016-08-18 DIAGNOSIS — K146 Glossodynia: Secondary | ICD-10-CM | POA: Diagnosis not present

## 2016-08-18 DIAGNOSIS — I728 Aneurysm of other specified arteries: Secondary | ICD-10-CM | POA: Diagnosis not present

## 2016-08-28 ENCOUNTER — Other Ambulatory Visit: Payer: Self-pay | Admitting: *Deleted

## 2016-08-28 ENCOUNTER — Encounter: Payer: Self-pay | Admitting: Vascular Surgery

## 2016-08-28 DIAGNOSIS — I728 Aneurysm of other specified arteries: Secondary | ICD-10-CM

## 2016-08-29 ENCOUNTER — Other Ambulatory Visit: Payer: Self-pay | Admitting: Vascular Surgery

## 2016-08-29 ENCOUNTER — Ambulatory Visit
Admission: RE | Admit: 2016-08-29 | Discharge: 2016-08-29 | Disposition: A | Discharge status: Home / Self Care | Payer: Medicare HMO | Source: Ambulatory Visit | Attending: Vascular Surgery | Admitting: Vascular Surgery

## 2016-08-29 DIAGNOSIS — I728 Aneurysm of other specified arteries: Secondary | ICD-10-CM | POA: Diagnosis not present

## 2016-08-29 MED ORDER — IOPAMIDOL (ISOVUE-370) INJECTION 76%
75.0000 mL | Freq: Once | INTRAVENOUS | Status: AC | PRN
  Administered 2016-08-29: 75 mL via INTRAVENOUS

## 2016-09-05 ENCOUNTER — Ambulatory Visit (INDEPENDENT_AMBULATORY_CARE_PROVIDER_SITE_OTHER): Payer: Medicare HMO | Admitting: Vascular Surgery

## 2016-09-05 ENCOUNTER — Encounter: Payer: Self-pay | Admitting: Vascular Surgery

## 2016-09-05 VITALS — BP 127/76 | HR 99 | Ht 63.0 in | Wt 110.0 lb

## 2016-09-05 DIAGNOSIS — I728 Aneurysm of other specified arteries: Secondary | ICD-10-CM | POA: Diagnosis not present

## 2016-09-05 NOTE — Progress Notes (Signed)
    [facility]  Patient name: Carrie Moody MRN: [id number] DOB: May 21, 1945 Sex: female  REASON FOR VISIT: Follow-up incidental finding splenic artery aneurysm  HPI: Carrie Moody is a 71 y.o. female here today for follow-up. She is here with her husband. She reports that she continues to have chronic abdominal pain associated with diverticulosis but is managing this. No symptoms referable to her splenic artery  Past Medical History:  Diagnosis Date  . Splenic artery aneurysm (HCC)     Family History  Problem Relation Age of Onset  . AAA (abdominal aortic aneurysm) Father     SOCIAL HISTORY: Social History  Substance Use Topics  . Smoking status: Never Smoker  . Smokeless tobacco: Never Used  . Alcohol use Yes     Comment: occasional    Allergies  Allergen Reactions  . Codeine Other (See Comments)    Patient doesn't recall exact reaction; remembers being "wired" after taking it.    Current Outpatient Prescriptions  Medication Sig Dispense Refill  . Vitamin D, Ergocalciferol, (DRISDOL) 50000 units CAPS capsule Take 50,000 Units by mouth every 7 (seven) days.    Marland Kitchen zolpidem (AMBIEN) 10 MG tablet Take 5 mg by mouth at bedtime as needed for sleep.     Marland Kitchen amoxicillin-clavulanate (AUGMENTIN) 875-125 MG tablet Take 1 tablet by mouth 2 (two) times daily. For 7 days, starting 08/04/15.    . ciprofloxacin (CIPRO) 500 MG tablet Take 500 mg by mouth 2 (two) times daily. For 5 days, starting 07/29/15.    Marland Kitchen LUMIGAN 0.01 % SOLN Place 1 drop into both eyes at bedtime.     . metroNIDAZOLE (FLAGYL) 500 MG tablet Take 500 mg by mouth 2 (two) times daily. For 5 days, starting 07/29/15.     No current facility-administered medications for this visit.     REVIEW OF SYSTEMS:  [X]  denotes positive finding, [ ]  denotes negative finding Cardiac  Comments:  Chest pain or chest pressure:    Shortness of breath upon exertion:      Short of breath when lying flat:    Irregular heart rhythm:        Vascular    Pain in calf, thigh, or hip brought on by ambulation:    Pain in feet at night that wakes you up from your sleep:     Blood clot in your veins:    Leg swelling:           PHYSICAL EXAM: Vitals:   09/05/16 1606  BP: 127/76  Pulse: 99  SpO2: 99%  Weight: 110 lb (49.9 kg)  Height: 5\' 3"  (1.6 m)    GENERAL: The patient is a well-nourished female, in no acute distress. The vital signs are documented above. PULMONARY: There is good air exchange  MUSCULOSKELETAL: There are no major deformities or cyanosis. NEUROLOGIC: No focal weakness or paresthesias are detected. SKIN: There are no ulcers or rashes noted. PSYCHIATRIC: The patient has a normal affect.  DATA:  CT was reviewed. Showed her films from this year and compare these to last years study. Shows no change in her calcified bilobed splenic artery aneurysm  MEDICAL ISSUES: One-year follow-up CT shows no change in her splenic artery aneurysm. Patient was reassured with this. Would recommend two-year follow-up with CT scan.    Rosetta Posner, MD FACS [facility] Tel [phone] Pager [phone]

## 2016-10-11 DIAGNOSIS — L820 Inflamed seborrheic keratosis: Secondary | ICD-10-CM | POA: Diagnosis not present

## 2016-10-11 DIAGNOSIS — D2339 Other benign neoplasm of skin of other parts of face: Secondary | ICD-10-CM | POA: Diagnosis not present

## 2016-10-25 ENCOUNTER — Ambulatory Visit (INDEPENDENT_AMBULATORY_CARE_PROVIDER_SITE_OTHER): Payer: Medicare HMO | Admitting: Ophthalmology

## 2016-10-25 ENCOUNTER — Encounter (INDEPENDENT_AMBULATORY_CARE_PROVIDER_SITE_OTHER): Payer: Self-pay | Admitting: Ophthalmology

## 2016-10-25 DIAGNOSIS — H02831 Dermatochalasis of right upper eyelid: Secondary | ICD-10-CM | POA: Diagnosis not present

## 2016-10-25 DIAGNOSIS — H2513 Age-related nuclear cataract, bilateral: Secondary | ICD-10-CM | POA: Diagnosis not present

## 2016-10-25 DIAGNOSIS — H16223 Keratoconjunctivitis sicca, not specified as Sjogren's, bilateral: Secondary | ICD-10-CM | POA: Diagnosis not present

## 2016-10-25 DIAGNOSIS — H40053 Ocular hypertension, bilateral: Secondary | ICD-10-CM | POA: Diagnosis not present

## 2016-10-25 DIAGNOSIS — H33312 Horseshoe tear of retina without detachment, left eye: Secondary | ICD-10-CM | POA: Diagnosis not present

## 2016-10-25 DIAGNOSIS — H40033 Anatomical narrow angle, bilateral: Secondary | ICD-10-CM | POA: Diagnosis not present

## 2016-10-25 DIAGNOSIS — H02834 Dermatochalasis of left upper eyelid: Secondary | ICD-10-CM | POA: Diagnosis not present

## 2016-10-25 DIAGNOSIS — H43812 Vitreous degeneration, left eye: Secondary | ICD-10-CM | POA: Diagnosis not present

## 2016-10-25 MED ORDER — PREDNISOLONE ACETATE 1 % OP SUSP: 1.0000 [drp] | Freq: Four times a day (QID) | OPHTHALMIC | 0 refills | Status: AC

## 2016-10-25 NOTE — Progress Notes (Signed)
[facility] Note  10/25/2016     CHIEF COMPLAINT Patient presents for Retina Evaluation   HISTORY OF PRESENT ILLNESS: Carrie Moody is a 71 y.o. female who presents to the clinic today for:   HPI    Retina Evaluation  In both eyes.  This started 2 weeks ago.  Duration of 2 days.  Associated Symptoms Floaters, Flashes and Pain.  Negative for Blind Spot, Photophobia, Scalp Tenderness, Fever, Weight Loss, Jaw Claudication, Glare, Distortion, Redness, Trauma, Shoulder/Hip pain and Fatigue.  Context:  watching TV and distance vision.  Treatments tried include eye drops and laser.  Response to treatment was no improvement.  I, the attending physician,  performed the HPI with the patient and updated documentation appropriately.        Comments  Referral of Dr. Shirleen Schirmer Retinal Eval.OS. Patient states she has had Floaters OD on off couple years . She has had a new onset of flashes OS 2 weeks ago.  . She states she had  laser Ou for narrow angle  Glaucoma .Patient uses lumigan OU @ HS and takes vitamin D 50,000 units QD      Last edited by Zenovia Jordan, LPN on 27/25/3664  4:03 PM. (History)      Referring physician: Warden Fillers, MD [address] [place], [place] [address]  HISTORICAL INFORMATION:   Selected notes from the MEDICAL RECORD NUMBER Referral form Dr. Shirleen Schirmer for possible RT OS;  Ocular Hx- glaucoma suspect w/ OHT; taking Restasis OU BID and Lumigan OU qhs; S/P LPI OU; DES; Keratoconjunctivitis sicca OU; PVD OS;  PMH-    CURRENT MEDICATIONS: Current Outpatient Prescriptions (Ophthalmic Drugs)  Medication Sig  . LUMIGAN 0.01 % SOLN Place 1 drop into both eyes at bedtime.   . prednisoLONE acetate (PRED FORTE) 1 % ophthalmic suspension Place 1 drop into the left eye 4 (four) times daily.   No current facility-administered medications for this visit.  (Ophthalmic Drugs)   Current Outpatient Prescriptions (Other)  Medication Sig   . Vitamin D, Ergocalciferol, (DRISDOL) 50000 units CAPS capsule Take 50,000 Units by mouth every 7 (seven) days.  Marland Kitchen zolpidem (AMBIEN) 10 MG tablet Take 5 mg by mouth at bedtime as needed for sleep.   Marland Kitchen zolpidem (AMBIEN) 5 MG tablet TAKE 1 TABLET BY MOUTH EVERYDAY AT BEDTIME  . amoxicillin-clavulanate (AUGMENTIN) 875-125 MG tablet Take 1 tablet by mouth 2 (two) times daily. For 7 days, starting 08/04/15.  . ciprofloxacin (CIPRO) 500 MG tablet Take 500 mg by mouth 2 (two) times daily. For 5 days, starting 07/29/15.  Marland Kitchen metroNIDAZOLE (FLAGYL) 500 MG tablet Take 500 mg by mouth 2 (two) times daily. For 5 days, starting 07/29/15.   No current facility-administered medications for this visit.  (Other)      REVIEW OF SYSTEMS: ROS    Positive for: Cardiovascular, Eyes   Negative for: Constitutional, Gastrointestinal, Neurological, Skin, Genitourinary, Musculoskeletal, HENT, Endocrine, Respiratory, Psychiatric, Allergic/Imm, Heme/Lymph   Last edited by Zenovia Jordan, LPN on 87/56/4332  9:51 PM. (History)       ALLERGIES Allergies  Allergen Reactions  . Codeine Other (See Comments)    Patient doesn't recall exact reaction; remembers being "wired" after taking it.    PAST MEDICAL HISTORY Past Medical History:  Diagnosis Date  . Glaucoma   . Splenic artery aneurysm [facility])    Past Surgical History:  Procedure Laterality Date  . ABDOMINAL HYSTERECTOMY    . EYE SURGERY    .  IRIDOTOMY / IRIDECTOMY  2017    FAMILY HISTORY Family History  Problem Relation Age of Onset  . AAA (abdominal aortic aneurysm) Father   . Cancer Father        lung  . Cataracts Mother   . Cancer Brother        head/neck ca  . Glaucoma Paternal Grandmother   . Diabetes Paternal Grandmother   . Glaucoma Paternal Grandfather     SOCIAL HISTORY Social History  Substance Use Topics  . Smoking status: Never Smoker  . Smokeless tobacco: Never Used  . Alcohol use Yes     Comment: occasional          OPHTHALMIC EXAM:  Base Eye Exam    Visual Acuity (Snellen - Linear)      Right Left   Dist [place] 20/100+2 20/100+2   Dist ph [place] 20/40 20/30+2       Tonometry (Tonopen, 2:07 PM)      Right Left   Pressure 18 21       Pupils      Dark Light Shape React APD   Right 8 8 Round Minimal None   Left 8 8 Round Minimal None   Patient dilated       Visual Fields      Left Right    Full Full       Extraocular Movement      Right Left    Full, Ortho Full, Ortho       Neuro/Psych    Oriented x3:  Yes   Mood/Affect:  Normal       Dilation    Both eyes:  1.0% Mydriacyl, 2.5% Phenylephrine @ 2:07 PM        Slit Lamp and Fundus Exam    External Exam      Right Left   External Normal Normal       Slit Lamp Exam      Right Left   Lids/Lashes UL dermato UL dermato   Conjunctiva/Sclera White and quiet White and quiet   Cornea 1+ PEE 1+ PEE   Anterior Chamber Deep and quiet Deep and quiet   Iris dilated; patent LPI at 0900 dilated; patent LPI at 4   Lens 2+ NS 2+ NS   Vitreous normal PVD       Fundus Exam      Right Left   Disc Normal nasal disc heme   C/D Ratio 0.45 0.7   Macula flat; good foveal reflex; mild RPE mottling flat; good foveal reflex; mild RPE mottling   Vessels Normal Normal   Periphery attached; mild midzonal drusen small flap tear 1030 oclock without SRF; retina attached; mild midzonal drusen        Refraction    Manifest Refraction (Auto, Retinoscopy)      Sphere Cylinder Axis Dist VA   Right +1.00 +0.50 147 20/25-2   Left +1.50 +0.50 010 20/30+2   Patient dilated ou          IMAGING AND PROCEDURES  Imaging and Procedures for 10/25/16  OCT, Retina - OU - Both Eyes     Right Eye Quality was good. Central Foveal Thickness: 252. Progression has no prior data. Findings include normal foveal contour, no SRF, no IRF.   Left Eye Quality was good. Central Foveal Thickness: 252. Progression has no prior data. Findings include normal foveal  contour, no SRF, no IRF.   Notes Images taken, stored on drive      Repair  Retinal Breaks, Laser - OS - Left Eye     LASER PROCEDURE NOTE  Procedure:  Barrier laser retinopexy using slit lamp laser, left eye   Diagnosis:   Retinal tear, left eye                     Small flap tear at 1030 o'clock equator   Surgeon: Bernarda Caffey, MD, PhD  Anesthesia: Topical  Informed consent obtained, operative eye marked, and time out performed prior to initiation of laser.   Laser settings:  Lumenis [id number] laser, slit lamp Lens: Mainster [id number] Power: 260 mW Spot size: 200 microns Duration: 50 msec  # spots: 136  Placement of laser: Using a [place] PRP [id number] contact lens at the slit lamp, laser was placed in three confluent rows around flap tear at 1030 oclock equator with additional rows anteriorly.  Complications: None.                ASSESSMENT/PLAN:    ICD-10-CM   1. Retinal tear of left eye H33.312 OCT, Retina - OU - Both Eyes    Repair Retinal Breaks, Laser - OS - Left Eye  2. Posterior vitreous detachment of left eye H43.812   3. 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 Nuclear sclerosis of both eyes H25.13     1, 2. Retinal tear without retinal detachment 2/2 PVD  - small tear at 1030 equator  - no SRF or RD  - discussed findings and prognosis  - recommend laser retinopexy OS  - RBA of procedure discussed, questions answered  - informed consent obtained and signed  - see procedure note  - f/u in 2 wks, sooner prn   3. Glaucoma suspect w/ history of OHT and narrow angles s/p LPI OU  - IOP 21 OS today  - LPIs patent  - management per Dr. Katy Fitch  4. Nuclear sclerosis OU  - The symptoms of cataract, surgical options, and treatments and risks were discussed with patient.  - discussed diagnosis and progression  - management per Dr. Katy Fitch   Ophthalmic Meds Ordered this visit:  Meds ordered this encounter  Medications  .  prednisoLONE acetate (PRED FORTE) 1 % ophthalmic suspension    Sig: Place 1 drop into the left eye 4 (four) times daily.    Dispense:  10 mL    Refill:  0       Return in about 2 weeks (around 11/08/2016) for POV s/p laser retinopexy OS on 10/25/16.  There are no Patient Instructions on file for this visit.   Explained the diagnoses, plan, and follow up with the patient and they expressed understanding.  Patient expressed understanding of the importance of proper follow up care.   Gardiner Sleeper, M.D., Ph.D. Vitreoretinal Surgeon [facility] 10/25/16     Abbreviations: M myopia (nearsighted); A astigmatism; H hyperopia (farsighted); P presbyopia; Mrx spectacle prescription;  CTL contact lenses; OD right eye; OS left eye; OU both eyes  XT exotropia; ET esotropia; PEK punctate epithelial keratitis; PEE punctate epithelial erosions; DES dry eye syndrome; MGD meibomian gland dysfunction; ATs artificial tears; PFAT's preservative free artificial tears; [place] nuclear sclerotic cataract; PSC posterior subcapsular cataract; ERM epi-retinal membrane; PVD posterior vitreous detachment; RD retinal detachment; DM diabetes mellitus; DR diabetic retinopathy; NPDR non-proliferative diabetic retinopathy; PDR proliferative diabetic retinopathy; CSME clinically significant macular edema; DME diabetic macular edema; dbh dot blot hemorrhages; CWS cotton wool spot; POAG  primary open angle glaucoma; C/D cup-to-disc ratio; HVF humphrey visual field; GVF goldmann visual field; OCT optical coherence tomography; IOP intraocular pressure; BRVO Branch retinal vein occlusion; CRVO central retinal vein occlusion; CRAO central retinal artery occlusion; BRAO branch retinal artery occlusion; RT retinal tear; SB scleral buckle; PPV pars plana vitrectomy; VH Vitreous hemorrhage; PRP panretinal laser photocoagulation; IVK intravitreal kenalog; VMT vitreomacular traction; MH Macular hole;  NVD  neovascularization of the disc; NVE neovascularization elsewhere; AREDS age related eye disease study; ARMD age related macular degeneration; POAG primary open angle glaucoma; EBMD epithelial/anterior basement membrane dystrophy; ACIOL anterior chamber intraocular lens; IOL intraocular lens; PCIOL posterior chamber intraocular lens; Phaco/IOL phacoemulsification with intraocular lens placement; [place] photorefractive keratectomy; LASIK laser assisted in situ keratomileusis; HTN hypertension; DM diabetes mellitus; COPD chronic obstructive pulmonary disease

## 2016-10-27 ENCOUNTER — Ambulatory Visit (INDEPENDENT_AMBULATORY_CARE_PROVIDER_SITE_OTHER): Payer: Medicare HMO | Admitting: Ophthalmology

## 2016-10-27 ENCOUNTER — Encounter (INDEPENDENT_AMBULATORY_CARE_PROVIDER_SITE_OTHER): Payer: Self-pay | Admitting: Ophthalmology

## 2016-10-27 DIAGNOSIS — H43812 Vitreous degeneration, left eye: Secondary | ICD-10-CM

## 2016-10-27 DIAGNOSIS — H33312 Horseshoe tear of retina without detachment, left eye: Secondary | ICD-10-CM

## 2016-10-27 DIAGNOSIS — H40033 Anatomical narrow angle, bilateral: Secondary | ICD-10-CM

## 2016-10-27 DIAGNOSIS — H2513 Age-related nuclear cataract, bilateral: Secondary | ICD-10-CM

## 2016-10-27 NOTE — Progress Notes (Signed)
[facility] Note  10/27/2016     CHIEF COMPLAINT Patient presents for Flashes/floaters   HISTORY OF PRESENT ILLNESS: Carrie Moody is a 71 y.o. female who presents to the clinic today for:   HPI    Flashes/floaters  In left eye.  This started hours ago.  Duration Constant.  Characterized as spots and small.  Since onset it is gradually worsening.  Associated Symptoms Floaters.  Context:  distance vision, mid-range vision and near vision.  Context: recent eye surgery (S/P laser retinopexy OS (10/25/16)).  I, the attending physician,  performed the HPI with the patient and updated documentation appropriately.        Comments  Pt reports increase in floaters post laser retinopexy OS (10/25/16);      Last edited by Bernarda Caffey, MD on 10/27/2016  3:26 PM. (History)      Referring physician: Warden Fillers, MD [address] [place], [place] [address]  HISTORICAL INFORMATION:   Selected notes from the MEDICAL RECORD NUMBER Referral form Dr. Shirleen Schirmer for possible RT OS;  Ocular Hx- glaucoma suspect w/ OHT; taking Restasis OU BID and Lumigan OU qhs; S/P LPI OU; DES; Keratoconjunctivitis sicca OU; PVD OS;  PMH-    CURRENT MEDICATIONS: Current Outpatient Prescriptions (Ophthalmic Drugs)  Medication Sig  . LUMIGAN 0.01 % SOLN Place 1 drop into both eyes at bedtime.   . prednisoLONE acetate (PRED FORTE) 1 % ophthalmic suspension Place 1 drop into the left eye 4 (four) times daily.   No current facility-administered medications for this visit.  (Ophthalmic Drugs)   Current Outpatient Prescriptions (Other)  Medication Sig  . amoxicillin-clavulanate (AUGMENTIN) 875-125 MG tablet Take 1 tablet by mouth 2 (two) times daily. For 7 days, starting 08/04/15.  . ciprofloxacin (CIPRO) 500 MG tablet Take 500 mg by mouth 2 (two) times daily. For 5 days, starting 07/29/15.  Marland Kitchen metroNIDAZOLE (FLAGYL) 500 MG tablet Take 500 mg by mouth 2 (two) times daily.  For 5 days, starting 07/29/15.  . Vitamin D, Ergocalciferol, (DRISDOL) 50000 units CAPS capsule Take 50,000 Units by mouth every 7 (seven) days.  Marland Kitchen zolpidem (AMBIEN) 10 MG tablet Take 5 mg by mouth at bedtime as needed for sleep.   Marland Kitchen zolpidem (AMBIEN) 5 MG tablet TAKE 1 TABLET BY MOUTH EVERYDAY AT BEDTIME   No current facility-administered medications for this visit.  (Other)      REVIEW OF SYSTEMS:    ALLERGIES Allergies  Allergen Reactions  . Codeine Other (See Comments)    Patient doesn't recall exact reaction; remembers being "wired" after taking it.    PAST MEDICAL HISTORY Past Medical History:  Diagnosis Date  . Glaucoma   . Splenic artery aneurysm [facility])    Past Surgical History:  Procedure Laterality Date  . ABDOMINAL HYSTERECTOMY    . EYE SURGERY    . IRIDOTOMY / IRIDECTOMY  2017    FAMILY HISTORY Family History  Problem Relation Age of Onset  . AAA (abdominal aortic aneurysm) Father   . Cancer Father        lung  . Cataracts Mother   . Cancer Brother        head/neck ca  . Glaucoma Paternal Grandmother   . Diabetes Paternal Grandmother   . Glaucoma Paternal Grandfather     SOCIAL HISTORY Social History  Substance Use Topics  . Smoking status: Never Smoker  . Smokeless tobacco: Never Used  . Alcohol use Yes  Comment: occasional         OPHTHALMIC EXAM:  Base Eye Exam    Visual Acuity (Snellen - Linear)      Right Left   Dist [place] 20/50 20/50   Dist ph [place] 20/40 20/30       Tonometry (Applanation, 2:51 PM)      Right Left   Pressure 19 20       Pupils      Dark Light Shape React APD   Right 5 4 Round Slow None   Left 5 4 Round Slow None       Neuro/Psych    Oriented x3:  Yes   Mood/Affect:  Normal       Dilation    Both eyes:  1.0% Mydriacyl, 2.5% Phenylephrine @ 2:52 PM        Slit Lamp and Fundus Exam    External Exam      Right Left   External Normal Normal       Slit Lamp Exam      Right Left   Lids/Lashes UL  dermato UL dermato   Conjunctiva/Sclera White and quiet White and quiet   Cornea 1+ PEE 1+ PEE   Anterior Chamber Deep and quiet Deep and quiet   Iris dilated; patent LPI at 0900 dilated; patent LPI at 4   Lens 2+ NS 2+ NS   Vitreous normal PVD       Fundus Exam      Right Left   Disc Normal nasal disc heme   C/D Ratio 0.45 0.7   Macula  flat; good foveal reflex; mild RPE mottling   Vessels  Normal   Periphery  small flap tear 1030 oclock without SRF - good early laser changes; retina attached; mild midzonal drusen          IMAGING AND PROCEDURES  Imaging and Procedures for 10/27/16           ASSESSMENT/PLAN:    ICD-10-CM   1. Retinal tear of left eye H33.312   2. Posterior vitreous detachment of left eye H43.812   3. 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 Nuclear sclerosis of both eyes H25.13     1, 2. Retinal tear without retinal detachment 2/2 PVD OS  - small tear at 1030 equator  - no SRF or RD  - s/p laser retinopexy OS 10/25/16  - pt w/ subjective increase in floaters without flashes today 10/27/16  - good early laser changes evident  - no new tears or detachment noted 360 on repeat exam  - Reviewed s/s of RT/RD  - Strict return precautions for any such RT/RD signs/symptoms  - f/u as scheduled, sooner prn   3. Glaucoma suspect w/ history of OHT and narrow angles s/p LPI OU  - IOP 20 OS today  - LPIs patent  - management per Dr. Katy Fitch  4. Nuclear sclerosis OU  - The symptoms of cataract, surgical options, and treatments and risks were discussed with patient.  - discussed diagnosis and progression  - management per Dr. Katy Fitch   Ophthalmic Meds Ordered this visit:  No orders of the defined types were placed in this encounter.      Return for as scheduled; sooner prn.  There are no Patient Instructions on file for this visit.   Explained the diagnoses, plan, and follow up with the patient and they expressed  understanding.  Patient expressed understanding of the importance of proper  follow up care.   Gardiner Sleeper, M.D., Ph.D. Vitreoretinal Surgeon [facility] 10/27/16     Abbreviations: M myopia (nearsighted); A astigmatism; H hyperopia (farsighted); P presbyopia; Mrx spectacle prescription;  CTL contact lenses; OD right eye; OS left eye; OU both eyes  XT exotropia; ET esotropia; PEK punctate epithelial keratitis; PEE punctate epithelial erosions; DES dry eye syndrome; MGD meibomian gland dysfunction; ATs artificial tears; PFAT's preservative free artificial tears; [place] nuclear sclerotic cataract; PSC posterior subcapsular cataract; ERM epi-retinal membrane; PVD posterior vitreous detachment; RD retinal detachment; DM diabetes mellitus; DR diabetic retinopathy; NPDR non-proliferative diabetic retinopathy; PDR proliferative diabetic retinopathy; CSME clinically significant macular edema; DME diabetic macular edema; dbh dot blot hemorrhages; CWS cotton wool spot; POAG primary open angle glaucoma; C/D cup-to-disc ratio; HVF humphrey visual field; GVF goldmann visual field; OCT optical coherence tomography; IOP intraocular pressure; BRVO Branch retinal vein occlusion; CRVO central retinal vein occlusion; CRAO central retinal artery occlusion; BRAO branch retinal artery occlusion; RT retinal tear; SB scleral buckle; PPV pars plana vitrectomy; VH Vitreous hemorrhage; PRP panretinal laser photocoagulation; IVK intravitreal kenalog; VMT vitreomacular traction; MH Macular hole;  NVD neovascularization of the disc; NVE neovascularization elsewhere; AREDS age related eye disease study; ARMD age related macular degeneration; POAG primary open angle glaucoma; EBMD epithelial/anterior basement membrane dystrophy; ACIOL anterior chamber intraocular lens; IOL intraocular lens; PCIOL posterior chamber intraocular lens; Phaco/IOL phacoemulsification with intraocular lens placement; [place] photorefractive  keratectomy; LASIK laser assisted in situ keratomileusis; HTN hypertension; DM diabetes mellitus; COPD chronic obstructive pulmonary disease

## 2016-11-02 ENCOUNTER — Telehealth (INDEPENDENT_AMBULATORY_CARE_PROVIDER_SITE_OTHER): Payer: Self-pay | Admitting: Ophthalmology

## 2016-11-02 NOTE — Progress Notes (Signed)
[facility] Note  11/03/2016     CHIEF COMPLAINT Patient presents for Flashes/floaters and Retina Follow Up   HISTORY OF PRESENT ILLNESS: Carrie Moody is a 71 y.o. female who presents to the clinic today for:   HPI    Flashes/floaters  In left eye.  This started 3 days ago.  Duration Intermittant.  Characterized as small and spots.  Since onset it is gradually worsening.  Associated Symptoms Flashes and Pain.  Negative for Distortion, Redness, Trauma, Shoulder/Hip pain, Fatigue, Weight Loss, Jaw Claudication, Glare, Blind Spot, Photophobia, Scalp Tenderness, Fever and Floaters.  Context:  distance vision, mid-range vision and watching TV.  Treatments tried include eye drops.  Response to treatment was no improvement.  I, the attending physician,  performed the HPI with the patient and updated documentation appropriately.        Retina Follow Up  Patient presents with  Other.  In left eye.  This started 2 days ago.  Since onset it is gradually worsening.  I, the attending physician,  performed the HPI with the patient and updated documentation appropriately.        Comments  S/P Laser retinopexy OS 10/25/16. Patient states she has  noticed flashes in her left eye the last two days  at bedtime. She continues to have vision of  a" flap" in left eye. She has not had floaters x 2 days . She had to take tylenol for headaches last two days.  She is using pred forte BID ( she aware it was ordered QID) and using  Lumigan QD. She is Taking  vitamin D Qd.     Last edited by Bernarda Caffey, MD on 11/03/2016 10:19 AM. (History)      Referring physician: Cari Caraway, MD [place], [place] [address]  HISTORICAL INFORMATION:   Selected notes from the MEDICAL RECORD NUMBER Referral form Dr. Shirleen Schirmer for possible RT OS;  Ocular Hx- glaucoma suspect w/ OHT; taking Restasis OU BID and Lumigan OU qhs; S/P LPI OU; DES; Keratoconjunctivitis sicca OU; PVD  OS;  PMH-    CURRENT MEDICATIONS: Current Outpatient Prescriptions (Ophthalmic Drugs)  Medication Sig  . LUMIGAN 0.01 % SOLN Place 1 drop into both eyes at bedtime.   . prednisoLONE acetate (PRED FORTE) 1 % ophthalmic suspension Place 1 drop into the left eye 4 (four) times daily.   No current facility-administered medications for this visit.  (Ophthalmic Drugs)   Current Outpatient Prescriptions (Other)  Medication Sig  . amoxicillin-clavulanate (AUGMENTIN) 875-125 MG tablet Take 1 tablet by mouth 2 (two) times daily. For 7 days, starting 08/04/15.  . ciprofloxacin (CIPRO) 500 MG tablet Take 500 mg by mouth 2 (two) times daily. For 5 days, starting 07/29/15.  Marland Kitchen metroNIDAZOLE (FLAGYL) 500 MG tablet Take 500 mg by mouth 2 (two) times daily. For 5 days, starting 07/29/15.  . Vitamin D, Ergocalciferol, (DRISDOL) 50000 units CAPS capsule Take 50,000 Units by mouth every 7 (seven) days.  Marland Kitchen zolpidem (AMBIEN) 10 MG tablet Take 5 mg by mouth at bedtime as needed for sleep.   Marland Kitchen zolpidem (AMBIEN) 5 MG tablet TAKE 1 TABLET BY MOUTH EVERYDAY AT BEDTIME   No current facility-administered medications for this visit.  (Other)      REVIEW OF SYSTEMS: ROS    Positive for: Cardiovascular, Eyes   Negative for: Constitutional, Gastrointestinal, Neurological, Skin, Genitourinary, Musculoskeletal, HENT, Endocrine, Respiratory, Psychiatric, Allergic/Imm, Heme/Lymph   Last edited by Zenovia Jordan,  LPN on 00/86/7619  5:09 AM. (History)       ALLERGIES Allergies  Allergen Reactions  . Codeine Other (See Comments)    Patient doesn't recall exact reaction; remembers being "wired" after taking it.    PAST MEDICAL HISTORY Past Medical History:  Diagnosis Date  . Glaucoma   . Splenic artery aneurysm [facility])    Past Surgical History:  Procedure Laterality Date  . ABDOMINAL HYSTERECTOMY    . EYE SURGERY    . IRIDOTOMY / IRIDECTOMY  2017    FAMILY HISTORY Family History  Problem Relation Age of  Onset  . AAA (abdominal aortic aneurysm) Father   . Cancer Father        lung  . Cataracts Mother   . Cancer Brother        head/neck ca  . Glaucoma Paternal Grandmother   . Diabetes Paternal Grandmother   . Glaucoma Paternal Grandfather     SOCIAL HISTORY Social History  Substance Use Topics  . Smoking status: Never Smoker  . Smokeless tobacco: Never Used  . Alcohol use Yes     Comment: occasional         OPHTHALMIC EXAM:  Base Eye Exam    Visual Acuity (Snellen - Linear)      Right Left   Dist cc 20/20 20/40   Near cc  J2   Correction:  Contacts       Tonometry (Tonopen, 10:37 AM)      Right Left   Pressure 16 20       Pupils      Dark Light Shape React APD   Right 3 2 Round Brisk None   Left 4 3 Round Brisk None       Visual Fields (Counting fingers)      Left Right    Full Full       Extraocular Movement      Right Left    Full, Ortho Full, Ortho       Neuro/Psych    Oriented x3:  Yes   Mood/Affect:  Normal       Dilation    Both eyes:  1.0% Mydriacyl, 2.5% Phenylephrine @ 10:18 AM        Slit Lamp and Fundus Exam    External Exam      Right Left   External Normal Normal       Slit Lamp Exam      Right Left   Lids/Lashes UL dermato UL dermato   Conjunctiva/Sclera White and quiet White and quiet   Cornea 1+ PEE 1+ PEE   Anterior Chamber Deep and quiet Deep and quiet   Iris dilated; patent LPI at 0900 dilated; patent LPI at 4   Lens 2+ NS 2+ NS   Vitreous normal PVD       Fundus Exam      Right Left   Disc Normal nasal disc heme   C/D Ratio 0.45 0.7   Macula  flat; good foveal reflex; mild RPE mottling   Vessels  Normal   Periphery  small flap tear 1030 oclock without SRF - good early laser changes; retina attached; mild midzonal drusen          IMAGING AND PROCEDURES  Imaging and Procedures for 11/03/16           ASSESSMENT/PLAN:    ICD-10-CM   1. Retinal tear of left eye H33.312   2. Posterior vitreous  detachment of left eye H43.812  3. 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 Nuclear sclerosis of both eyes H25.13     1, 2. Retinal tear without retinal detachment 2/2 PVD OS  - small tear at 1030 equator  - no SRF or RD  - s/p laser retinopexy OS 10/25/16  - pt w/ flashes at bedtime last two nights  - good early laser changes evident  - no new tears or detachment noted 360 on repeat exam  - Reviewed s/s of RT/RD  - Strict return precautions for any such RT/RD signs/symptoms  - f/u as scheduled, sooner prn   3. Glaucoma suspect w/ history of OHT and narrow angles s/p LPI OU  - IOP 20 OS today  - LPIs patent  - management per Dr. Katy Fitch  4. Nuclear sclerosis OU  - The symptoms of cataract, surgical options, and treatments and risks were discussed with patient.  - discussed diagnosis and progression  - management per Dr. Katy Fitch   Ophthalmic Meds Ordered this visit:  No orders of the defined types were placed in this encounter.      Return in about 10 days (around 11/13/2016) for POV s/p laser retinopexy OS.  There are no Patient Instructions on file for this visit.   Explained the diagnoses, plan, and follow up with the patient and they expressed understanding.  Patient expressed understanding of the importance of proper follow up care.   Gardiner Sleeper, M.D., Ph.D. Diseases & Surgery of the Retina and Vitreous [facility] 11/03/16       Abbreviations: M myopia (nearsighted); A astigmatism; H hyperopia (farsighted); P presbyopia; Mrx spectacle prescription;  CTL contact lenses; OD right eye; OS left eye; OU both eyes  XT exotropia; ET esotropia; PEK punctate epithelial keratitis; PEE punctate epithelial erosions; DES dry eye syndrome; MGD meibomian gland dysfunction; ATs artificial tears; PFAT's preservative free artificial tears; [place] nuclear sclerotic cataract; PSC posterior subcapsular cataract; ERM  epi-retinal membrane; PVD posterior vitreous detachment; RD retinal detachment; DM diabetes mellitus; DR diabetic retinopathy; NPDR non-proliferative diabetic retinopathy; PDR proliferative diabetic retinopathy; CSME clinically significant macular edema; DME diabetic macular edema; dbh dot blot hemorrhages; CWS cotton wool spot; POAG primary open angle glaucoma; C/D cup-to-disc ratio; HVF humphrey visual field; GVF goldmann visual field; OCT optical coherence tomography; IOP intraocular pressure; BRVO Branch retinal vein occlusion; CRVO central retinal vein occlusion; CRAO central retinal artery occlusion; BRAO branch retinal artery occlusion; RT retinal tear; SB scleral buckle; PPV pars plana vitrectomy; VH Vitreous hemorrhage; PRP panretinal laser photocoagulation; IVK intravitreal kenalog; VMT vitreomacular traction; MH Macular hole;  NVD neovascularization of the disc; NVE neovascularization elsewhere; AREDS age related eye disease study; ARMD age related macular degeneration; POAG primary open angle glaucoma; EBMD epithelial/anterior basement membrane dystrophy; ACIOL anterior chamber intraocular lens; IOL intraocular lens; PCIOL posterior chamber intraocular lens; Phaco/IOL phacoemulsification with intraocular lens placement; [place] photorefractive keratectomy; LASIK laser assisted in situ keratomileusis; HTN hypertension; DM diabetes mellitus; COPD chronic obstructive pulmonary disease

## 2016-11-02 NOTE — Telephone Encounter (Signed)
Pt called stating that she "maybe saw a flash on light last night"; Pt states that she is unsure if she would need to be seen today or not;  Per Dr. Coralyn Pear pt to monitor since incident is isolated; Pt wishes to be seen tomorrow - made appt for 10.19.18 @ 0915.

## 2016-11-03 ENCOUNTER — Ambulatory Visit (INDEPENDENT_AMBULATORY_CARE_PROVIDER_SITE_OTHER): Payer: Medicare HMO | Admitting: Ophthalmology

## 2016-11-03 DIAGNOSIS — H2513 Age-related nuclear cataract, bilateral: Secondary | ICD-10-CM

## 2016-11-03 DIAGNOSIS — H33312 Horseshoe tear of retina without detachment, left eye: Secondary | ICD-10-CM

## 2016-11-03 DIAGNOSIS — H40033 Anatomical narrow angle, bilateral: Secondary | ICD-10-CM

## 2016-11-03 DIAGNOSIS — H43812 Vitreous degeneration, left eye: Secondary | ICD-10-CM

## 2016-11-08 ENCOUNTER — Encounter (INDEPENDENT_AMBULATORY_CARE_PROVIDER_SITE_OTHER): Payer: Medicare HMO | Admitting: Ophthalmology

## 2016-11-10 NOTE — Progress Notes (Signed)
[facility] Note  11/13/2016     CHIEF COMPLAINT Patient presents for Retina Follow Up   HISTORY OF PRESENT ILLNESS: Carrie Moody is a 71 y.o. female who presents to the clinic today for:   HPI    Retina Follow Up  In left eye.  Since onset it is rapidly improving.  I, the attending physician,  performed the HPI with the patient and updated documentation appropriately.        Comments  POV#4 S/P laser retinopexy OS . Patient states Va is stable denies  having pain, floaters or flashes She is using lumigan OU @hs . She has stopped pred forte a week ago . She feels her vision has rapidly improved she no longer has any symptoms  She takes Vitamin D only .   Pt endorses possibly missing lumigan on Saturday night;      Last edited by Bernarda Caffey, MD on 11/13/2016 10:17 AM. (History)      Referring physician: Warden Fillers, MD [address] [place], [place] [id number]  HISTORICAL INFORMATION:   Selected notes from the MEDICAL RECORD NUMBER Referral form Dr. Shirleen Schirmer for possible RT OS;  Ocular Hx- glaucoma suspect w/ OHT; taking Restasis OU BID and Lumigan OU qhs; S/P LPI OU; DES; Keratoconjunctivitis sicca OU; PVD OS;  PMH-    CURRENT MEDICATIONS: Current Outpatient Prescriptions (Ophthalmic Drugs)  Medication Sig  . LUMIGAN 0.01 % SOLN Place 1 drop into both eyes at bedtime.   . prednisoLONE acetate (PRED FORTE) 1 % ophthalmic suspension Place 1 drop into the left eye 4 (four) times daily. (Patient not taking: Reported on 11/13/2016)   No current facility-administered medications for this visit.  (Ophthalmic Drugs)   Current Outpatient Prescriptions (Other)  Medication Sig  . amoxicillin-clavulanate (AUGMENTIN) 875-125 MG tablet Take 1 tablet by mouth 2 (two) times daily. For 7 days, starting 08/04/15.  . ciprofloxacin (CIPRO) 500 MG tablet Take 500 mg by mouth 2 (two) times daily. For 5 days, starting 07/29/15.  Marland Kitchen metroNIDAZOLE  (FLAGYL) 500 MG tablet Take 500 mg by mouth 2 (two) times daily. For 5 days, starting 07/29/15.  . Vitamin D, Ergocalciferol, (DRISDOL) 50000 units CAPS capsule Take 50,000 Units by mouth every 7 (seven) days.  Marland Kitchen zolpidem (AMBIEN) 10 MG tablet Take 5 mg by mouth at bedtime as needed for sleep.   Marland Kitchen zolpidem (AMBIEN) 5 MG tablet TAKE 1 TABLET BY MOUTH EVERYDAY AT BEDTIME   No current facility-administered medications for this visit.  (Other)      REVIEW OF SYSTEMS: ROS    Positive for: Cardiovascular, Eyes   Negative for: Constitutional, Gastrointestinal, Neurological, Skin, Genitourinary, Musculoskeletal, HENT, Endocrine, Respiratory, Psychiatric, Allergic/Imm, Heme/Lymph   Last edited by Zenovia Jordan, LPN on 82/99/3716  9:67 AM. (History)       ALLERGIES Allergies  Allergen Reactions  . Codeine Other (See Comments)    Patient doesn't recall exact reaction; remembers being "wired" after taking it.    PAST MEDICAL HISTORY Past Medical History:  Diagnosis Date  . Glaucoma   . Splenic artery aneurysm [facility])    Past Surgical History:  Procedure Laterality Date  . ABDOMINAL HYSTERECTOMY    . EYE SURGERY    . IRIDOTOMY / IRIDECTOMY  2017    FAMILY HISTORY Family History  Problem Relation Age of Onset  . AAA (abdominal aortic aneurysm) Father   . Cancer Father        lung  .  Cataracts Mother   . Cancer Brother        head/neck ca  . Glaucoma Paternal Grandmother   . Diabetes Paternal Grandmother   . Glaucoma Paternal Grandfather     SOCIAL HISTORY Social History  Substance Use Topics  . Smoking status: Never Smoker  . Smokeless tobacco: Never Used  . Alcohol use Yes     Comment: occasional         OPHTHALMIC EXAM:  Base Eye Exam    Visual Acuity (Snellen - Linear)      Right Left   Dist cc 20/25 -2 20/40   Dist ph cc 20/25 20/25   Correction:  Contacts       Tonometry (Tonopen, 10:04 AM)      Right Left   Pressure 26 27       Pupils      Dark  Light Shape React APD   Right 3 2 Round 2 None   Left 4 3 Round 2 None       Visual Fields (Counting fingers)      Left Right    Full Full       Extraocular Movement      Right Left    Full, Ortho Full, Ortho       Neuro/Psych    Oriented x3:  Yes   Mood/Affect:  Normal       Dilation    Both eyes:  1.0% Mydriacyl, 2.5% Phenylephrine @ 9:50 AM        Slit Lamp and Fundus Exam    External Exam      Right Left   External Normal Normal       Slit Lamp Exam      Right Left   Lids/Lashes UL dermato UL dermato   Conjunctiva/Sclera White and quiet White and quiet   Cornea 1+ PEE 1+ PEE   Anterior Chamber Deep and quiet Deep and quiet   Iris dilated; patent LPI at 0900 dilated; patent LPI at 4   Lens 2+ NS 2+ NS   Vitreous normal PVD       Fundus Exam      Right Left   Disc Normal nasal disc heme - improving   C/D Ratio 0.45 0.7   Macula flat; good foveal reflex; mild RPE mottling flat; good foveal reflex; mild RPE mottling   Vessels Normal Normal   Periphery attached; mild midzonal drusen small flap tear 1030 oclock without SRF - good laser surrounding; retina attached; mild midzonal drusen          IMAGING AND PROCEDURES  Imaging and Procedures for 11/13/16           ASSESSMENT/PLAN:    ICD-10-CM   1. Retinal tear of left eye H33.312   2. Posterior vitreous detachment of left eye H43.812   3. 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 Nuclear sclerosis of both eyes H25.13     1, 2. Retinal tear without retinal detachment 2/2 PVD OS  - small tear at 1030 equator  - no SRF or RD  - s/p laser retinopexy OS 10/25/16  - no new symptoms  - good laser surrounding  - no new tears or detachment noted 360 on repeat exam  - Reviewed s/s of RT/RD  - Strict return precautions for any such RT/RD signs/symptoms  - f/u prn   3. Glaucoma suspect w/ history of OHT and narrow angles s/p LPI OU  -  IOP 27 OS today  - LPIs  patent  - management per Dr. Katy Fitch  4. Nuclear sclerosis OU  - The symptoms of cataract, surgical options, and treatments and risks were discussed with patient.  - discussed diagnosis and progression  - management per Dr. Katy Fitch   Ophthalmic Meds Ordered this visit:  No orders of the defined types were placed in this encounter.      Return if symptoms worsen or fail to improve.  There are no Patient Instructions on file for this visit.   Explained the diagnoses, plan, and follow up with the patient and they expressed understanding.  Patient expressed understanding of the importance of proper follow up care.   Gardiner Sleeper, M.D., Ph.D. Diseases & Surgery of the Retina and Vitreous [facility] 11/13/16       Abbreviations: M myopia (nearsighted); A astigmatism; H hyperopia (farsighted); P presbyopia; Mrx spectacle prescription;  CTL contact lenses; OD right eye; OS left eye; OU both eyes  XT exotropia; ET esotropia; PEK punctate epithelial keratitis; PEE punctate epithelial erosions; DES dry eye syndrome; MGD meibomian gland dysfunction; ATs artificial tears; PFAT's preservative free artificial tears; [place] nuclear sclerotic cataract; PSC posterior subcapsular cataract; ERM epi-retinal membrane; PVD posterior vitreous detachment; RD retinal detachment; DM diabetes mellitus; DR diabetic retinopathy; NPDR non-proliferative diabetic retinopathy; PDR proliferative diabetic retinopathy; CSME clinically significant macular edema; DME diabetic macular edema; dbh dot blot hemorrhages; CWS cotton wool spot; POAG primary open angle glaucoma; C/D cup-to-disc ratio; HVF humphrey visual field; GVF goldmann visual field; OCT optical coherence tomography; IOP intraocular pressure; BRVO Branch retinal vein occlusion; CRVO central retinal vein occlusion; CRAO central retinal artery occlusion; BRAO branch retinal artery occlusion; RT retinal tear; SB scleral buckle; PPV pars plana  vitrectomy; VH Vitreous hemorrhage; PRP panretinal laser photocoagulation; IVK intravitreal kenalog; VMT vitreomacular traction; MH Macular hole;  NVD neovascularization of the disc; NVE neovascularization elsewhere; AREDS age related eye disease study; ARMD age related macular degeneration; POAG primary open angle glaucoma; EBMD epithelial/anterior basement membrane dystrophy; ACIOL anterior chamber intraocular lens; IOL intraocular lens; PCIOL posterior chamber intraocular lens; Phaco/IOL phacoemulsification with intraocular lens placement; [place] photorefractive keratectomy; LASIK laser assisted in situ keratomileusis; HTN hypertension; DM diabetes mellitus; COPD chronic obstructive pulmonary disease

## 2016-11-13 ENCOUNTER — Encounter (INDEPENDENT_AMBULATORY_CARE_PROVIDER_SITE_OTHER): Payer: Self-pay | Admitting: Ophthalmology

## 2016-11-13 ENCOUNTER — Ambulatory Visit (INDEPENDENT_AMBULATORY_CARE_PROVIDER_SITE_OTHER): Payer: Medicare HMO | Admitting: Ophthalmology

## 2016-11-13 DIAGNOSIS — H40033 Anatomical narrow angle, bilateral: Secondary | ICD-10-CM

## 2016-11-13 DIAGNOSIS — H2513 Age-related nuclear cataract, bilateral: Secondary | ICD-10-CM

## 2016-11-13 DIAGNOSIS — H33312 Horseshoe tear of retina without detachment, left eye: Secondary | ICD-10-CM

## 2016-11-13 DIAGNOSIS — H43812 Vitreous degeneration, left eye: Secondary | ICD-10-CM

## 2016-11-20 ENCOUNTER — Encounter: Payer: Self-pay | Admitting: Dietician

## 2016-11-20 ENCOUNTER — Encounter: Payer: Medicare HMO | Attending: Family Medicine | Admitting: Dietician

## 2016-11-20 DIAGNOSIS — E440 Moderate protein-calorie malnutrition: Secondary | ICD-10-CM | POA: Diagnosis not present

## 2016-11-20 DIAGNOSIS — Z713 Dietary counseling and surveillance: Secondary | ICD-10-CM | POA: Insufficient documentation

## 2016-11-20 NOTE — Patient Instructions (Signed)
Small meals and snacks throughout the day. Have protein with each meal and snack.  Chicken  Pork  Beef  Shrimp  Eggs  Cheese  Peanut butter  Whole milk yogurt  Beans Meals should be balanced with protein, carbohydrates, vegetables, fat. Consider more nutrient dense foods to have throughout the day. Consider Carnation Breakfast made with 12 ounces of whole milk (optional banana or berries). Consider other smoothie options. Made more soup and serve with cheese and bread. Consider convenience foods such as rotisserie chicken. Consider eating out more frequently.  Breakfast ideas:  Pancakes, eggs, fruit  Muffin, fruit, boiled egg  Cream of wheat or cream of rice made with whole milk, boiled egg, fruit  Whole milk yogurt, fruit  Lunch ideas:   Any of the above  Salad with protein (boiled egg, cheese, meat), fruit, crackers  Soup, cheese, crackers or bread  Egg salad or chicken sandwich with fruit  Peanut butter sandwich and fruit  Drink more water  Drink a cup in the morning before coffee  Drink a bottle in the morning and another in the afternoon as you are working.

## 2016-11-20 NOTE — Progress Notes (Signed)
Medical Nutrition Therapy:  Appt start time: 0910 end time:  1015.    Assessment:  Primary concerns today: Patient is here today alone due to a referral for moderate protein calorie malnutrition.  Patient reports issues started 2 years ago when she had a colonoscopy and had a problem recovering from this.  She lost from 123 lbs and states that she was 99 lbs last Christmas. Weight today is 107 lbs. She states that she would like to gain to 110 lbs.  She has diverticulosis and states that this flares up when she eats certain foods.  She also has a burning tongue and this was evaluated fully by a doctor at the [facility].  She is unable to tolerate garlic, cinnamon and other foods due to the burning.  She reports that multiple labs for vitamin deficiencies were done which were normal.  She has also followed up with Dr. Erik Obey who recommended swishing mouth with a diluted hot sauce solution as a homeopathic option but she has not tried it yet.  She states that she is not good at following through with appointments and recommendations. History does include a vitamin D deficiency (15.4), osteopenia, and significant sleep issues.  She can only sleep about 4 hours each night for most of her life.     Patient lives with her husband.  He does most of the shopping and is retired except for contract work.  He does not know how to cook.  She "hates to cook" and will grab anything that does not take any time to prepare or fill her up, eat fast food or other restaurant.  Her and her husband also have a parent with dementia (one in nursing facility and are working to deal with the house and the other with caregivers in the home). 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    Her nails appear normal.  Coating of white on her tongue which she states has been evaluated with no known cause.  She has muscle loss at temple region.  Lack of fat at  upper arm and ribs.  She reports walking without difficulty and normal strength overall.  Her diet is of poor quality and she often misses meals.  Preferred Learning Style:   No preference indicated   Learning Readiness:   Contemplating  MEDICATIONS: see list to include Vitamin D   DIETARY INTAKE:  Usual eating pattern includes 1 meals and 3 snacks per day.  Everyday foods include cheez-its, fruit, bread, tomatoes.  Avoided foods include crust on bread (hurts tongue), garlic, cinnamon, ginger, raw onion, basil, rosemary, parsley, and other herbs, too much black pepper, curry, cumin.  Husband drinks Premier protein and she used to drink them but now "chalky", tried Boost and Ensure but did not like these.  Decreased appetite.  She couldn't eat chicken or beef for a long time after the colonoscopy 2 years ago.  Now tolerated it if it is very well done.  Tolerates shrimp.  Loves vegetables, tolerates rice, beans.       24-hr recall:  B ( AM): coffee with half and half and splenda  Snk (11 AM): cheez-its  L ( PM): none Snk ( PM): fruit, cheese D (6 PM): hungry jack instant mashed potatoes OR McDonald's:  Hamburger, french fries, diet coke OR pasta with alfredo sauce, salad, bread OR hot wings OR fajitas Snk ( PM): fruit, bread  or crackers Beverages: 10-12 ounces coffee with half and half and splenda, diet coke, "not much" water, 3 glasses wine per week  Usual physical activity: Owns elliptical and weight bench but she does not use often.  Walks once per week for 30 minutes.  Estimated energy needs: 1800-2000 calories for weight gain 60 g protein  Progress Towards Goal(s):  In progress.   Nutritional Diagnosis:  NI-5.2 Malnutrition As related to poor quality nutrition .  As evidenced by diet hx.    Intervention:  Nutrition counseling/education for improvement of weight and nutritional status and normalization of eating habits. Discussed tips to increase calories.  Importance of  regularly balanced meals. Exercise as desired but not to overdue as to lose further weight or prevent improvement in current status.  Simple meal planning discussed with recommendation to eat out more frequently if this would help her improve her nutritional intake.  Small meals and snacks throughout the day. Have protein with each meal and snack.  Chicken  Pork  Beef  Shrimp  Eggs  Cheese  Peanut butter  Whole milk yogurt  Beans Meals should be balanced with protein, carbohydrates, vegetables, fat. Consider more nutrient dense foods to have throughout the day. Consider Carnation Breakfast made with 12 ounces of whole milk (optional banana or berries). Consider other smoothie options. Made more soup and serve with cheese and bread. Consider convenience foods such as rotisserie chicken. Consider eating out more frequently.  Breakfast ideas:  Pancakes, eggs, fruit  Muffin, fruit, boiled egg  Cream of wheat or cream of rice made with whole milk, boiled egg, fruit  Whole milk yogurt, fruit  Teaching Method Utilized:  Visual Auditory   Barriers to learning/adherence to lifestyle change: time,  decreased appetite  Demonstrated degree of understanding via:  Teach Back   Monitoring/Evaluation:  Dietary intake, exercise, and body weight prn.

## 2017-04-25 DIAGNOSIS — H16223 Keratoconjunctivitis sicca, not specified as Sjogren's, bilateral: Secondary | ICD-10-CM | POA: Diagnosis not present

## 2017-04-25 DIAGNOSIS — H43812 Vitreous degeneration, left eye: Secondary | ICD-10-CM | POA: Diagnosis not present

## 2017-04-25 DIAGNOSIS — H2513 Age-related nuclear cataract, bilateral: Secondary | ICD-10-CM | POA: Diagnosis not present

## 2017-04-25 DIAGNOSIS — H02831 Dermatochalasis of right upper eyelid: Secondary | ICD-10-CM | POA: Diagnosis not present

## 2017-04-25 DIAGNOSIS — H02834 Dermatochalasis of left upper eyelid: Secondary | ICD-10-CM | POA: Diagnosis not present

## 2017-04-25 DIAGNOSIS — H40053 Ocular hypertension, bilateral: Secondary | ICD-10-CM | POA: Diagnosis not present

## 2017-04-25 DIAGNOSIS — H40033 Anatomical narrow angle, bilateral: Secondary | ICD-10-CM | POA: Diagnosis not present

## 2017-06-18 DIAGNOSIS — H40033 Anatomical narrow angle, bilateral: Secondary | ICD-10-CM | POA: Diagnosis not present

## 2017-06-18 DIAGNOSIS — H2513 Age-related nuclear cataract, bilateral: Secondary | ICD-10-CM | POA: Diagnosis not present

## 2017-06-18 DIAGNOSIS — H02831 Dermatochalasis of right upper eyelid: Secondary | ICD-10-CM | POA: Diagnosis not present

## 2017-06-18 DIAGNOSIS — H43812 Vitreous degeneration, left eye: Secondary | ICD-10-CM | POA: Diagnosis not present

## 2017-06-18 DIAGNOSIS — H02834 Dermatochalasis of left upper eyelid: Secondary | ICD-10-CM | POA: Diagnosis not present

## 2017-06-18 DIAGNOSIS — H16223 Keratoconjunctivitis sicca, not specified as Sjogren's, bilateral: Secondary | ICD-10-CM | POA: Diagnosis not present

## 2017-06-18 DIAGNOSIS — H40053 Ocular hypertension, bilateral: Secondary | ICD-10-CM | POA: Diagnosis not present

## 2017-08-03 DIAGNOSIS — H40033 Anatomical narrow angle, bilateral: Secondary | ICD-10-CM | POA: Diagnosis not present

## 2017-08-03 DIAGNOSIS — H02831 Dermatochalasis of right upper eyelid: Secondary | ICD-10-CM | POA: Diagnosis not present

## 2017-08-03 DIAGNOSIS — H43812 Vitreous degeneration, left eye: Secondary | ICD-10-CM | POA: Diagnosis not present

## 2017-08-03 DIAGNOSIS — H02834 Dermatochalasis of left upper eyelid: Secondary | ICD-10-CM | POA: Diagnosis not present

## 2017-08-03 DIAGNOSIS — H2513 Age-related nuclear cataract, bilateral: Secondary | ICD-10-CM | POA: Diagnosis not present

## 2017-08-03 DIAGNOSIS — H16223 Keratoconjunctivitis sicca, not specified as Sjogren's, bilateral: Secondary | ICD-10-CM | POA: Diagnosis not present

## 2017-08-03 DIAGNOSIS — H40053 Ocular hypertension, bilateral: Secondary | ICD-10-CM | POA: Diagnosis not present

## 2017-08-22 DIAGNOSIS — D2272 Melanocytic nevi of left lower limb, including hip: Secondary | ICD-10-CM | POA: Diagnosis not present

## 2017-08-22 DIAGNOSIS — L821 Other seborrheic keratosis: Secondary | ICD-10-CM | POA: Diagnosis not present

## 2017-08-22 DIAGNOSIS — D1801 Hemangioma of skin and subcutaneous tissue: Secondary | ICD-10-CM | POA: Diagnosis not present

## 2017-08-22 DIAGNOSIS — D225 Melanocytic nevi of trunk: Secondary | ICD-10-CM | POA: Diagnosis not present

## 2017-08-22 DIAGNOSIS — L814 Other melanin hyperpigmentation: Secondary | ICD-10-CM | POA: Diagnosis not present

## 2017-09-04 DIAGNOSIS — I728 Aneurysm of other specified arteries: Secondary | ICD-10-CM | POA: Diagnosis not present

## 2017-09-04 DIAGNOSIS — M859 Disorder of bone density and structure, unspecified: Secondary | ICD-10-CM | POA: Diagnosis not present

## 2017-09-04 DIAGNOSIS — R0989 Other specified symptoms and signs involving the circulatory and respiratory systems: Secondary | ICD-10-CM | POA: Diagnosis not present

## 2017-09-04 DIAGNOSIS — Z0001 Encounter for general adult medical examination with abnormal findings: Secondary | ICD-10-CM | POA: Diagnosis not present

## 2017-09-04 DIAGNOSIS — Z1231 Encounter for screening mammogram for malignant neoplasm of breast: Secondary | ICD-10-CM | POA: Diagnosis not present

## 2017-09-04 DIAGNOSIS — H40009 Preglaucoma, unspecified, unspecified eye: Secondary | ICD-10-CM | POA: Diagnosis not present

## 2017-09-04 DIAGNOSIS — G479 Sleep disorder, unspecified: Secondary | ICD-10-CM | POA: Diagnosis not present

## 2017-09-04 DIAGNOSIS — Z1389 Encounter for screening for other disorder: Secondary | ICD-10-CM | POA: Diagnosis not present

## 2017-09-04 DIAGNOSIS — R7301 Impaired fasting glucose: Secondary | ICD-10-CM | POA: Diagnosis not present

## 2017-09-06 ENCOUNTER — Other Ambulatory Visit: Payer: Self-pay | Admitting: Family Medicine

## 2017-09-06 DIAGNOSIS — Z1231 Encounter for screening mammogram for malignant neoplasm of breast: Secondary | ICD-10-CM

## 2017-09-10 ENCOUNTER — Other Ambulatory Visit: Payer: Self-pay

## 2017-09-10 DIAGNOSIS — I728 Aneurysm of other specified arteries: Secondary | ICD-10-CM

## 2017-10-03 ENCOUNTER — Ambulatory Visit: Payer: Medicare HMO

## 2017-10-23 ENCOUNTER — Other Ambulatory Visit: Payer: Self-pay

## 2017-10-23 ENCOUNTER — Encounter

## 2017-10-23 ENCOUNTER — Ambulatory Visit (HOSPITAL_COMMUNITY)
Admission: RE | Admit: 2017-10-23 | Discharge: 2017-10-23 | Disposition: A | Discharge status: Home / Self Care | Payer: Medicare HMO | Source: Ambulatory Visit | Attending: Vascular Surgery | Admitting: Vascular Surgery

## 2017-10-23 ENCOUNTER — Ambulatory Visit (INDEPENDENT_AMBULATORY_CARE_PROVIDER_SITE_OTHER): Payer: Medicare HMO | Admitting: Vascular Surgery

## 2017-10-23 ENCOUNTER — Encounter: Payer: Self-pay | Admitting: Vascular Surgery

## 2017-10-23 VITALS — BP 137/74 | HR 105 | Temp 97.4°F | Resp 18 | Ht 62.0 in | Wt 109.0 lb

## 2017-10-23 DIAGNOSIS — I728 Aneurysm of other specified arteries: Secondary | ICD-10-CM

## 2017-10-23 DIAGNOSIS — K551 Chronic vascular disorders of intestine: Secondary | ICD-10-CM

## 2017-10-23 DIAGNOSIS — I771 Stricture of artery: Secondary | ICD-10-CM

## 2017-10-23 NOTE — Progress Notes (Signed)
[facility] [place]  Patient name: Carrie Moody MRN: [id number] DOB: 1946/01/02 Sex: female  REASON FOR VISIT: Evaluation of abdominal bruit  HPI: JADON RESSLER is a 72 y.o. female known to me from prior evaluation of splenic artery aneurysm which was an incidental finding.  I had seen her most recently in August 2018 at which time the splenic artery aneurysm was stable and I recommended 2-year follow-up.  She recently was found to have abdominal bruit by Dr. Leonides Schanz and is seen today for further evaluation.  He denies any prior history of his enteric ischemic symptoms.  Specifically no postprandial pain.  She does have known diverticulosis and occasionally has stable chronic left lower quadrant discomfort.  She has had no weight loss.  Past Medical History:  Diagnosis Date  . Glaucoma   . Splenic artery aneurysm (HCC)     Family History  Problem Relation Age of Onset  . AAA (abdominal aortic aneurysm) Father   . Cancer Father        lung  . Cataracts Mother   . Cancer Brother        head/neck ca  . Glaucoma Paternal Grandmother   . Diabetes Paternal Grandmother   . Glaucoma Paternal Grandfather     SOCIAL HISTORY: Social History   Tobacco Use  . Smoking status: Never Smoker  . Smokeless tobacco: Never Used  Substance Use Topics  . Alcohol use: Yes    Comment: occasional    Allergies  Allergen Reactions  . Codeine Other (See Comments)    Patient doesn't recall exact reaction; remembers being "wired" after taking it.    Current Outpatient Medications  Medication Sig Dispense Refill  . LUMIGAN 0.01 % SOLN Place 1 drop into both eyes at bedtime.     . Vitamin D, Ergocalciferol, (DRISDOL) 50000 units CAPS capsule Take 50,000 Units 2 (two) times a week by mouth.     . zolpidem (AMBIEN) 5 MG tablet TAKE 1 TABLET BY MOUTH EVERYDAY AT BEDTIME  2  . amoxicillin-clavulanate (AUGMENTIN) 875-125 MG tablet Take 1  tablet by mouth 2 (two) times daily. For 7 days, starting 08/04/15.    . ciprofloxacin (CIPRO) 500 MG tablet Take 500 mg by mouth 2 (two) times daily. For 5 days, starting 07/29/15.    Marland Kitchen metroNIDAZOLE (FLAGYL) 500 MG tablet Take 500 mg by mouth 2 (two) times daily. For 5 days, starting 07/29/15.    Marland Kitchen prednisoLONE acetate (PRED FORTE) 1 % ophthalmic suspension Place 1 drop into the left eye 4 (four) times daily. (Patient not taking: Reported on 11/13/2016) 10 mL 0  . zolpidem (AMBIEN) 10 MG tablet Take 5 mg by mouth at bedtime as needed for sleep.      No current facility-administered medications for this visit.     REVIEW OF SYSTEMS:  [X]  denotes positive finding, [ ]  denotes negative finding Cardiac  Comments:  Chest pain or chest pressure:    Shortness of breath upon exertion:    Short of breath when lying flat:    Irregular heart rhythm:        Vascular    Pain in calf, thigh, or hip brought on by ambulation:    Pain in feet at night that wakes you up from your sleep:     Blood clot in your veins:    Leg swelling:           PHYSICAL EXAM: Vitals:   10/23/17 0843  BP: 137/74  Pulse: (!) 105  Resp: 18  Temp: (!) 97.4 F (36.3 C)  TempSrc: Oral  SpO2: 98%  Weight: 109 lb (49.4 kg)  Height: 5\' 2"  (1.575 m)    GENERAL: The patient is a well-nourished female, in no acute distress. The vital signs are documented above. CARDIOVASCULAR: Carotid arteries without bruits bilaterally.  2+ radial, 2+ femoral and 2+ dorsalis pedis pulses bilaterally.  Abd: she is thin and I am able to hear her pulsation in her aorta.  I do not hear a bruit today PULMONARY: There is good air exchange  MUSCULOSKELETAL: There are no major deformities or cyanosis. NEUROLOGIC: No focal weakness or paresthesias are detected. SKIN: There are no ulcers or rashes noted. PSYCHIATRIC: The patient has a normal affect.  DATA:  She did undergo abdominal visceral duplex in our office today.  This suggests  elevated velocities at her superior mesenteric artery origin and distal to the origin.  MEDICAL ISSUES: I had a long discussion with the patient and her husband present.  I reviewed her CT scan from August 2018 showing widely patent celiac and superior mesenteric artery.  Explained that it would be extremely unlikely that she would develop a tight stenosis in her superior mesenteric artery and just over one year.  She had no evidence of atherosclerotic change in her prior CT scan.  I did explain the possibility of dissection or some other unusual cause for this which would be highly unlikely.  I also explained that even if she did have a high stent degree of stenosis that would in all likelihood not recommend treatment since she is asymptomatic.  We will obtain a CT angiogram both for follow-up of her splenic artery aneurysm and also for evaluation of her mesenteric vascular flow.  I will notify her by telephone with the findings.    Rosetta Posner, MD FACS [facility] Tel [phone] Pager [phone]

## 2017-10-26 ENCOUNTER — Encounter: Payer: Self-pay | Admitting: Family Medicine

## 2017-10-29 ENCOUNTER — Ambulatory Visit
Admission: RE | Admit: 2017-10-29 | Discharge: 2017-10-29 | Disposition: A | Discharge status: Home / Self Care | Payer: Medicare HMO | Source: Ambulatory Visit | Attending: Family Medicine | Admitting: Family Medicine

## 2017-10-29 DIAGNOSIS — Z1231 Encounter for screening mammogram for malignant neoplasm of breast: Secondary | ICD-10-CM

## 2017-11-05 ENCOUNTER — Other Ambulatory Visit: Payer: Medicare HMO

## 2017-11-07 ENCOUNTER — Ambulatory Visit
Admission: RE | Admit: 2017-11-07 | Discharge: 2017-11-07 | Disposition: A | Discharge status: Home / Self Care | Payer: Medicare HMO | Source: Ambulatory Visit | Attending: Vascular Surgery | Admitting: Vascular Surgery

## 2017-11-07 DIAGNOSIS — I771 Stricture of artery: Secondary | ICD-10-CM

## 2017-11-07 DIAGNOSIS — I728 Aneurysm of other specified arteries: Secondary | ICD-10-CM

## 2017-11-07 DIAGNOSIS — K551 Chronic vascular disorders of intestine: Secondary | ICD-10-CM

## 2017-11-07 DIAGNOSIS — N281 Cyst of kidney, acquired: Secondary | ICD-10-CM | POA: Diagnosis not present

## 2017-11-07 MED ORDER — IOPAMIDOL (ISOVUE-370) INJECTION 76%
75.0000 mL | Freq: Once | INTRAVENOUS | Status: AC | PRN
  Administered 2017-11-07: 75 mL via INTRAVENOUS

## 2017-11-13 ENCOUNTER — Telehealth: Payer: Self-pay | Admitting: Vascular Surgery

## 2017-11-13 NOTE — Telephone Encounter (Signed)
Patient underwent CT angios abdomen and pelvis 11/07/2017 for follow-up of her known splenic artery aneurysm and also due to the duplex of her abdomen suggesting possible superior mesenteric artery stenosis.  The CT showed no evidence of  superior mesenteric artery stenosis and showed no change in her 1.8 cm splenic artery aneurysm.  I discussed this by telephone with Carrie Moody.  Commended that we see her again in 2 years with ultrasound to confirm no expansion and her splenic artery aneurysm.

## 2017-11-28 DIAGNOSIS — M8588 Other specified disorders of bone density and structure, other site: Secondary | ICD-10-CM | POA: Diagnosis not present

## 2017-12-17 DIAGNOSIS — H2513 Age-related nuclear cataract, bilateral: Secondary | ICD-10-CM | POA: Diagnosis not present

## 2017-12-17 DIAGNOSIS — H40053 Ocular hypertension, bilateral: Secondary | ICD-10-CM | POA: Diagnosis not present

## 2017-12-17 DIAGNOSIS — H40033 Anatomical narrow angle, bilateral: Secondary | ICD-10-CM | POA: Diagnosis not present

## 2017-12-17 DIAGNOSIS — H43812 Vitreous degeneration, left eye: Secondary | ICD-10-CM | POA: Diagnosis not present

## 2017-12-17 DIAGNOSIS — H02831 Dermatochalasis of right upper eyelid: Secondary | ICD-10-CM | POA: Diagnosis not present

## 2017-12-17 DIAGNOSIS — H02834 Dermatochalasis of left upper eyelid: Secondary | ICD-10-CM | POA: Diagnosis not present

## 2017-12-17 DIAGNOSIS — H16223 Keratoconjunctivitis sicca, not specified as Sjogren's, bilateral: Secondary | ICD-10-CM | POA: Diagnosis not present

## 2017-12-26 ENCOUNTER — Other Ambulatory Visit: Payer: Self-pay | Admitting: Family Medicine

## 2017-12-26 DIAGNOSIS — Z1231 Encounter for screening mammogram for malignant neoplasm of breast: Secondary | ICD-10-CM

## 2018-06-19 DIAGNOSIS — H40053 Ocular hypertension, bilateral: Secondary | ICD-10-CM | POA: Diagnosis not present

## 2018-06-19 DIAGNOSIS — H16223 Keratoconjunctivitis sicca, not specified as Sjogren's, bilateral: Secondary | ICD-10-CM | POA: Diagnosis not present

## 2018-06-19 DIAGNOSIS — H02834 Dermatochalasis of left upper eyelid: Secondary | ICD-10-CM | POA: Diagnosis not present

## 2018-06-19 DIAGNOSIS — H2513 Age-related nuclear cataract, bilateral: Secondary | ICD-10-CM | POA: Diagnosis not present

## 2018-06-19 DIAGNOSIS — H40033 Anatomical narrow angle, bilateral: Secondary | ICD-10-CM | POA: Diagnosis not present

## 2018-06-19 DIAGNOSIS — H43812 Vitreous degeneration, left eye: Secondary | ICD-10-CM | POA: Diagnosis not present

## 2018-06-19 DIAGNOSIS — H02831 Dermatochalasis of right upper eyelid: Secondary | ICD-10-CM | POA: Diagnosis not present

## 2018-10-01 DIAGNOSIS — H409 Unspecified glaucoma: Secondary | ICD-10-CM | POA: Diagnosis not present

## 2018-10-01 DIAGNOSIS — Z1389 Encounter for screening for other disorder: Secondary | ICD-10-CM | POA: Diagnosis not present

## 2018-10-01 DIAGNOSIS — Z0000 Encounter for general adult medical examination without abnormal findings: Secondary | ICD-10-CM | POA: Diagnosis not present

## 2018-10-01 DIAGNOSIS — M85852 Other specified disorders of bone density and structure, left thigh: Secondary | ICD-10-CM | POA: Diagnosis not present

## 2018-10-01 DIAGNOSIS — G479 Sleep disorder, unspecified: Secondary | ICD-10-CM | POA: Diagnosis not present

## 2018-12-02 ENCOUNTER — Ambulatory Visit: Payer: Medicare HMO

## 2018-12-06 DIAGNOSIS — H16223 Keratoconjunctivitis sicca, not specified as Sjogren's, bilateral: Secondary | ICD-10-CM | POA: Diagnosis not present

## 2018-12-06 DIAGNOSIS — H2513 Age-related nuclear cataract, bilateral: Secondary | ICD-10-CM | POA: Diagnosis not present

## 2018-12-06 DIAGNOSIS — H40033 Anatomical narrow angle, bilateral: Secondary | ICD-10-CM | POA: Diagnosis not present

## 2018-12-06 DIAGNOSIS — H02831 Dermatochalasis of right upper eyelid: Secondary | ICD-10-CM | POA: Diagnosis not present

## 2018-12-06 DIAGNOSIS — H43812 Vitreous degeneration, left eye: Secondary | ICD-10-CM | POA: Diagnosis not present

## 2018-12-06 DIAGNOSIS — H02834 Dermatochalasis of left upper eyelid: Secondary | ICD-10-CM | POA: Diagnosis not present

## 2018-12-06 DIAGNOSIS — H40053 Ocular hypertension, bilateral: Secondary | ICD-10-CM | POA: Diagnosis not present

## 2019-01-11 DIAGNOSIS — R509 Fever, unspecified: Secondary | ICD-10-CM | POA: Diagnosis not present

## 2019-01-31 ENCOUNTER — Other Ambulatory Visit: Payer: Self-pay | Admitting: Family Medicine

## 2019-01-31 DIAGNOSIS — Z1231 Encounter for screening mammogram for malignant neoplasm of breast: Secondary | ICD-10-CM

## 2019-02-04 ENCOUNTER — Ambulatory Visit: Payer: Medicare HMO

## 2019-02-06 ENCOUNTER — Ambulatory Visit: Payer: Medicare HMO | Attending: Internal Medicine

## 2019-02-06 DIAGNOSIS — Z23 Encounter for immunization: Secondary | ICD-10-CM

## 2019-02-06 NOTE — Progress Notes (Signed)
   Covid-19 Vaccination Clinic  Name:  Carrie Moody    MRN: [id number] DOB: 1945-04-01  02/06/2019  [name] was observed post Covid-19 immunization for 15 minutes without incidence. She was provided with Vaccine Information Sheet and instruction to access the V-Safe system.   [name] was instructed to call 911 with any severe reactions post vaccine: Marland Kitchen Difficulty breathing  . Swelling of your face and throat  . A fast heartbeat  . A bad rash all over your body  . Dizziness and weakness    Immunizations Administered    Name Date Dose VIS Date Route   Pfizer COVID-19 Vaccine 02/06/2019  3:41 PM 0.3 mL 12/27/2018 Intramuscular   Manufacturer: [place]   Lot: [id number]   [place]: [id number]

## 2019-02-26 ENCOUNTER — Ambulatory Visit: Payer: Medicare HMO | Attending: Internal Medicine

## 2019-02-26 DIAGNOSIS — Z23 Encounter for immunization: Secondary | ICD-10-CM

## 2019-02-26 NOTE — Progress Notes (Signed)
   Covid-19 Vaccination Clinic  Name:  Rosiland Youngstrom    MRN: [id number] DOB: [birth year]  02/26/2019  Ms. Scritchfield was observed post Covid-19 immunization for 15 minutes without incidence. She was provided with Vaccine Information Sheet and instruction to access the V-Safe system.   Ms. Diersen was instructed to call 911 with any severe reactions post vaccine: Marland Kitchen Difficulty breathing  . Swelling of your face and throat  . A fast heartbeat  . A bad rash all over your body  . Dizziness and weakness    Immunizations Administered    Name Date Dose VIS Date Route   Pfizer COVID-19 Vaccine 02/26/2019  1:55 PM 0.3 mL 12/27/2018 Intramuscular   Manufacturer: [place]   Lot: [id number]   [place]: [id number]

## 2019-03-14 ENCOUNTER — Ambulatory Visit: Payer: Medicare HMO

## 2019-04-09 DIAGNOSIS — H2513 Age-related nuclear cataract, bilateral: Secondary | ICD-10-CM | POA: Diagnosis not present

## 2019-04-09 DIAGNOSIS — H40033 Anatomical narrow angle, bilateral: Secondary | ICD-10-CM | POA: Diagnosis not present

## 2019-04-09 DIAGNOSIS — H02834 Dermatochalasis of left upper eyelid: Secondary | ICD-10-CM | POA: Diagnosis not present

## 2019-04-09 DIAGNOSIS — H16223 Keratoconjunctivitis sicca, not specified as Sjogren's, bilateral: Secondary | ICD-10-CM | POA: Diagnosis not present

## 2019-04-09 DIAGNOSIS — H02831 Dermatochalasis of right upper eyelid: Secondary | ICD-10-CM | POA: Diagnosis not present

## 2019-04-09 DIAGNOSIS — H40053 Ocular hypertension, bilateral: Secondary | ICD-10-CM | POA: Diagnosis not present

## 2019-04-16 ENCOUNTER — Ambulatory Visit: Payer: Medicare HMO

## 2019-05-14 ENCOUNTER — Ambulatory Visit: Payer: Medicare HMO

## 2019-05-29 DIAGNOSIS — G479 Sleep disorder, unspecified: Secondary | ICD-10-CM | POA: Diagnosis not present

## 2019-05-29 DIAGNOSIS — M85852 Other specified disorders of bone density and structure, left thigh: Secondary | ICD-10-CM | POA: Diagnosis not present

## 2019-05-29 DIAGNOSIS — M7062 Trochanteric bursitis, left hip: Secondary | ICD-10-CM | POA: Diagnosis not present

## 2019-05-29 DIAGNOSIS — Z136 Encounter for screening for cardiovascular disorders: Secondary | ICD-10-CM | POA: Diagnosis not present

## 2019-05-29 DIAGNOSIS — E559 Vitamin D deficiency, unspecified: Secondary | ICD-10-CM | POA: Diagnosis not present

## 2019-05-29 DIAGNOSIS — R7309 Other abnormal glucose: Secondary | ICD-10-CM | POA: Diagnosis not present

## 2019-06-11 DIAGNOSIS — M25552 Pain in left hip: Secondary | ICD-10-CM | POA: Diagnosis not present

## 2019-06-11 DIAGNOSIS — M7062 Trochanteric bursitis, left hip: Secondary | ICD-10-CM | POA: Diagnosis not present

## 2019-06-13 DIAGNOSIS — H40033 Anatomical narrow angle, bilateral: Secondary | ICD-10-CM | POA: Diagnosis not present

## 2019-06-13 DIAGNOSIS — H40053 Ocular hypertension, bilateral: Secondary | ICD-10-CM | POA: Diagnosis not present

## 2019-06-17 DIAGNOSIS — M7062 Trochanteric bursitis, left hip: Secondary | ICD-10-CM | POA: Diagnosis not present

## 2019-06-17 DIAGNOSIS — M25552 Pain in left hip: Secondary | ICD-10-CM | POA: Diagnosis not present

## 2019-06-18 ENCOUNTER — Ambulatory Visit: Payer: Medicare HMO

## 2019-06-19 DIAGNOSIS — M25552 Pain in left hip: Secondary | ICD-10-CM | POA: Diagnosis not present

## 2019-06-19 DIAGNOSIS — M7062 Trochanteric bursitis, left hip: Secondary | ICD-10-CM | POA: Diagnosis not present

## 2019-06-25 DIAGNOSIS — M7062 Trochanteric bursitis, left hip: Secondary | ICD-10-CM | POA: Diagnosis not present

## 2019-06-25 DIAGNOSIS — M25552 Pain in left hip: Secondary | ICD-10-CM | POA: Diagnosis not present

## 2019-06-27 DIAGNOSIS — M7062 Trochanteric bursitis, left hip: Secondary | ICD-10-CM | POA: Diagnosis not present

## 2019-06-27 DIAGNOSIS — M25552 Pain in left hip: Secondary | ICD-10-CM | POA: Diagnosis not present

## 2019-06-30 DIAGNOSIS — M25552 Pain in left hip: Secondary | ICD-10-CM | POA: Diagnosis not present

## 2019-06-30 DIAGNOSIS — M7062 Trochanteric bursitis, left hip: Secondary | ICD-10-CM | POA: Diagnosis not present

## 2019-07-03 DIAGNOSIS — M25552 Pain in left hip: Secondary | ICD-10-CM | POA: Diagnosis not present

## 2019-07-03 DIAGNOSIS — M7062 Trochanteric bursitis, left hip: Secondary | ICD-10-CM | POA: Diagnosis not present

## 2019-07-07 DIAGNOSIS — M7062 Trochanteric bursitis, left hip: Secondary | ICD-10-CM | POA: Diagnosis not present

## 2019-07-07 DIAGNOSIS — M25552 Pain in left hip: Secondary | ICD-10-CM | POA: Diagnosis not present

## 2019-07-09 DIAGNOSIS — M7062 Trochanteric bursitis, left hip: Secondary | ICD-10-CM | POA: Diagnosis not present

## 2019-07-09 DIAGNOSIS — M25552 Pain in left hip: Secondary | ICD-10-CM | POA: Diagnosis not present

## 2019-07-15 DIAGNOSIS — M7062 Trochanteric bursitis, left hip: Secondary | ICD-10-CM | POA: Diagnosis not present

## 2019-07-15 DIAGNOSIS — M25552 Pain in left hip: Secondary | ICD-10-CM | POA: Diagnosis not present

## 2019-07-17 DIAGNOSIS — M25552 Pain in left hip: Secondary | ICD-10-CM | POA: Diagnosis not present

## 2019-07-17 DIAGNOSIS — M7062 Trochanteric bursitis, left hip: Secondary | ICD-10-CM | POA: Diagnosis not present

## 2019-07-24 DIAGNOSIS — M7062 Trochanteric bursitis, left hip: Secondary | ICD-10-CM | POA: Diagnosis not present

## 2019-07-24 DIAGNOSIS — M25552 Pain in left hip: Secondary | ICD-10-CM | POA: Diagnosis not present

## 2019-07-28 ENCOUNTER — Ambulatory Visit: Payer: Medicare HMO

## 2019-07-29 DIAGNOSIS — M25552 Pain in left hip: Secondary | ICD-10-CM | POA: Diagnosis not present

## 2019-07-29 DIAGNOSIS — M7062 Trochanteric bursitis, left hip: Secondary | ICD-10-CM | POA: Diagnosis not present

## 2019-08-05 DIAGNOSIS — M7062 Trochanteric bursitis, left hip: Secondary | ICD-10-CM | POA: Diagnosis not present

## 2019-08-05 DIAGNOSIS — M25552 Pain in left hip: Secondary | ICD-10-CM | POA: Diagnosis not present

## 2019-08-07 DIAGNOSIS — M7062 Trochanteric bursitis, left hip: Secondary | ICD-10-CM | POA: Diagnosis not present

## 2019-08-07 DIAGNOSIS — M25552 Pain in left hip: Secondary | ICD-10-CM | POA: Diagnosis not present

## 2019-08-08 IMAGING — MG DIGITAL SCREENING BILATERAL MAMMOGRAM WITH TOMO AND CAD
8 series · 9 of 24 positions shown · non-contrast
Comparison: Previous exam(s).

CLINICAL DATA: Screening.

EXAM:
DIGITAL SCREENING BILATERAL MAMMOGRAM WITH TOMO AND CAD

[L CC synth-2D]
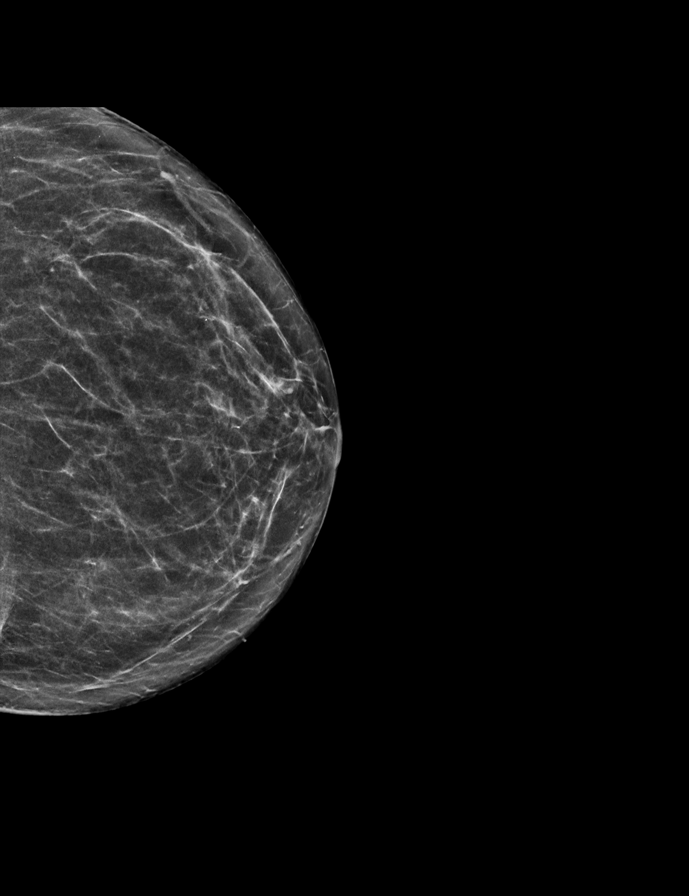

[L MLO synth-2D]
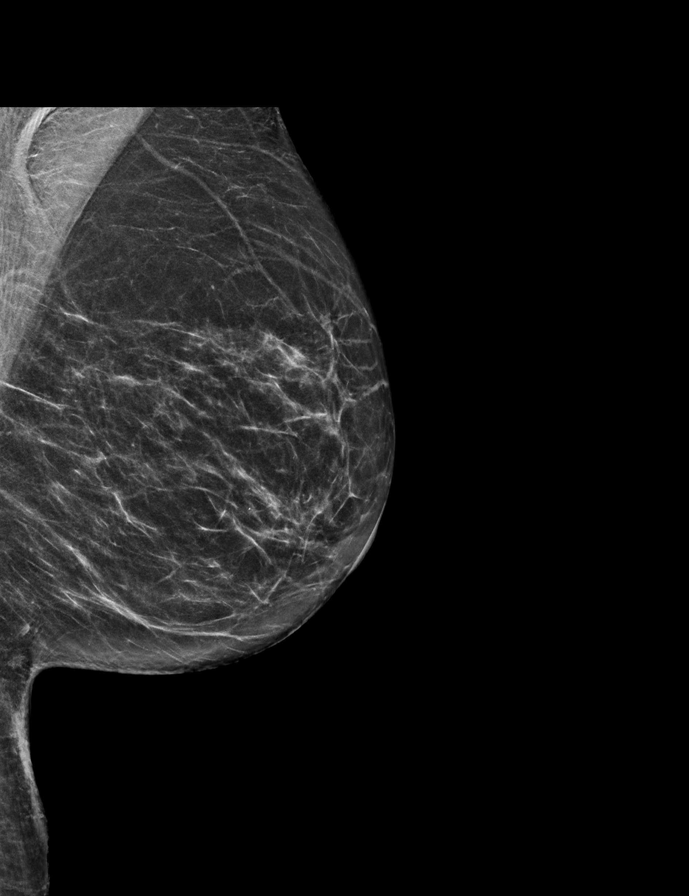

[R MLO synth-2D]
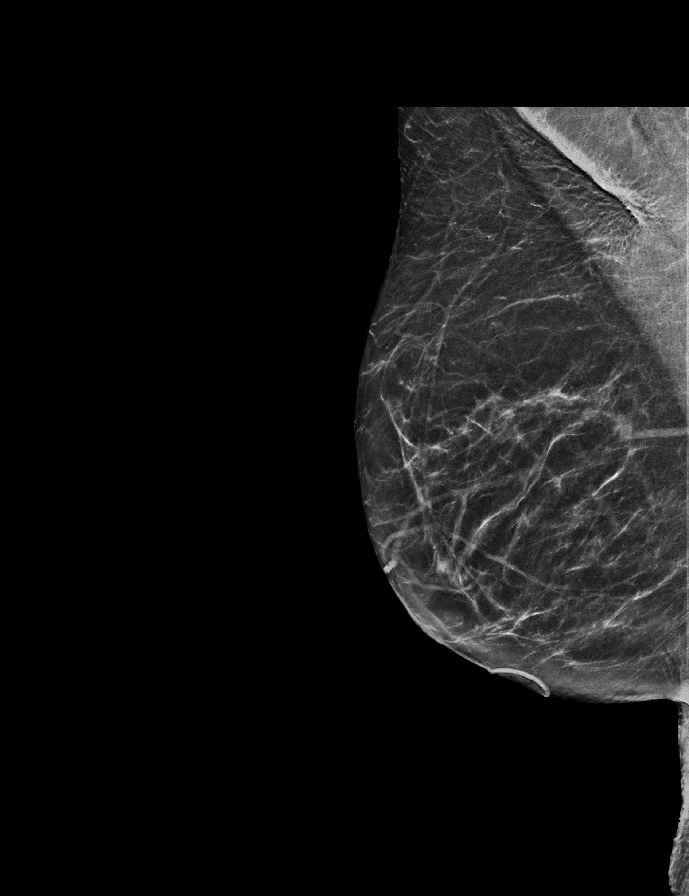

[R CC synth-2D]
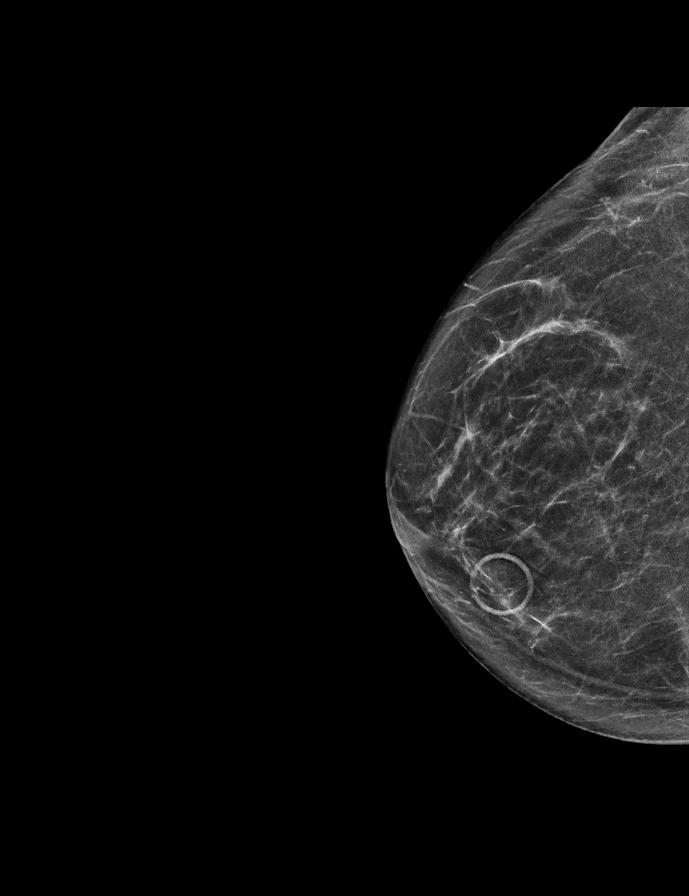

[R CC tomo · 2 of 57 frames shown]
[frame 19/57]
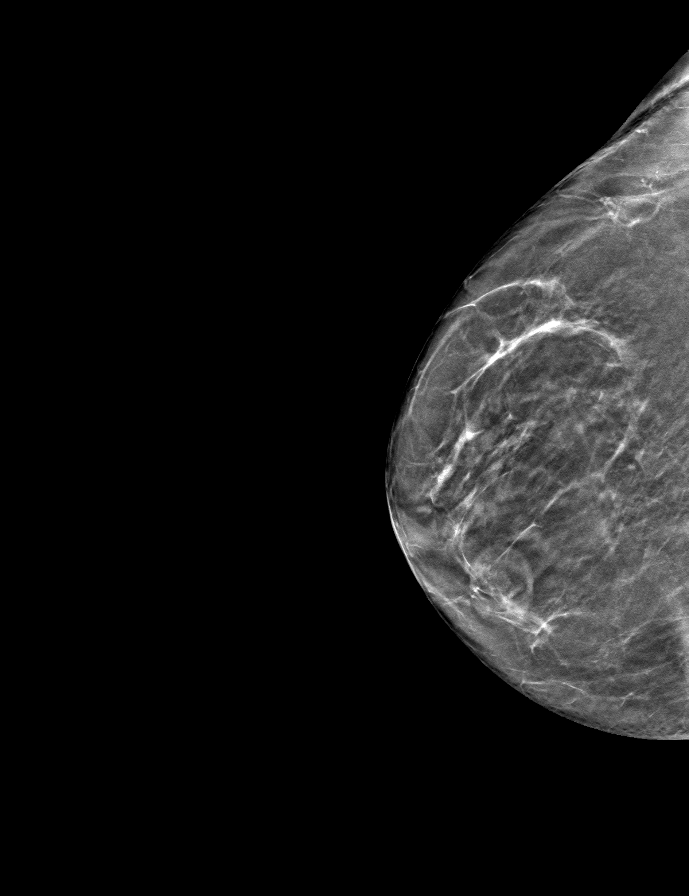
[frame 29/57]
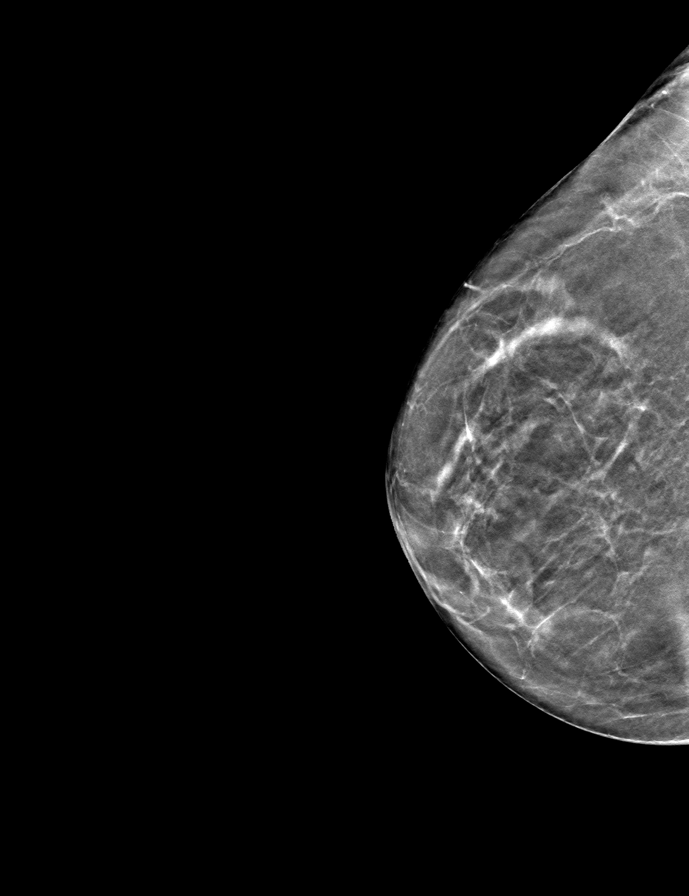

[R MLO tomo · tomo slice 29/58.0]
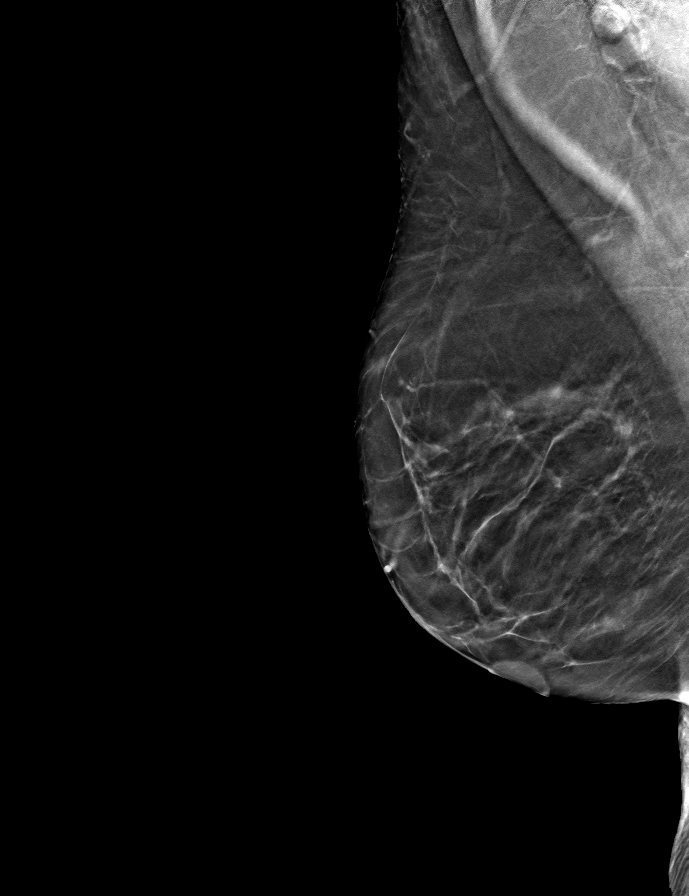

[L CC tomo · tomo slice 30/59.0]
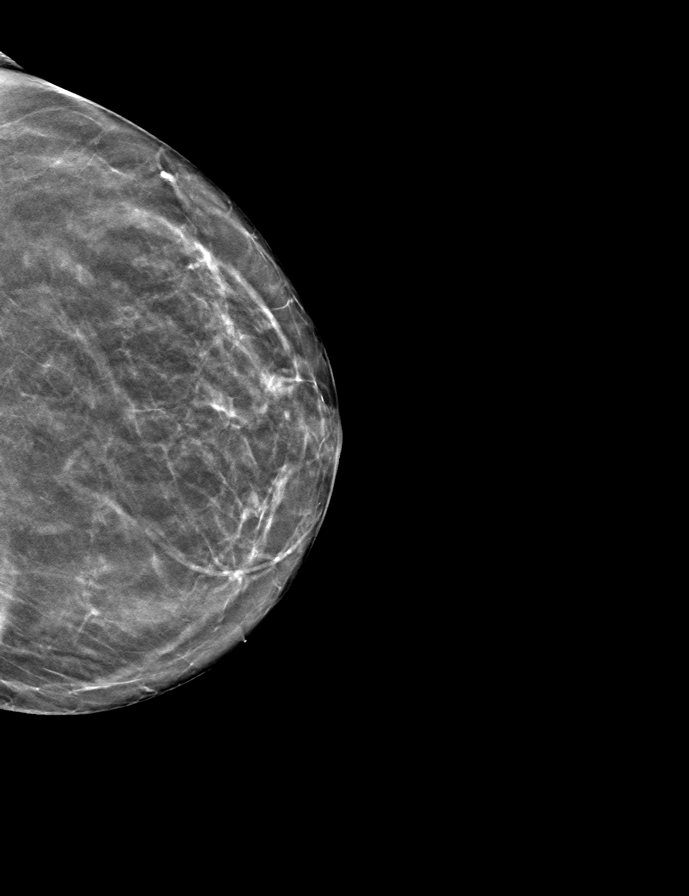

[L MLO tomo · tomo slice 32/63.0]
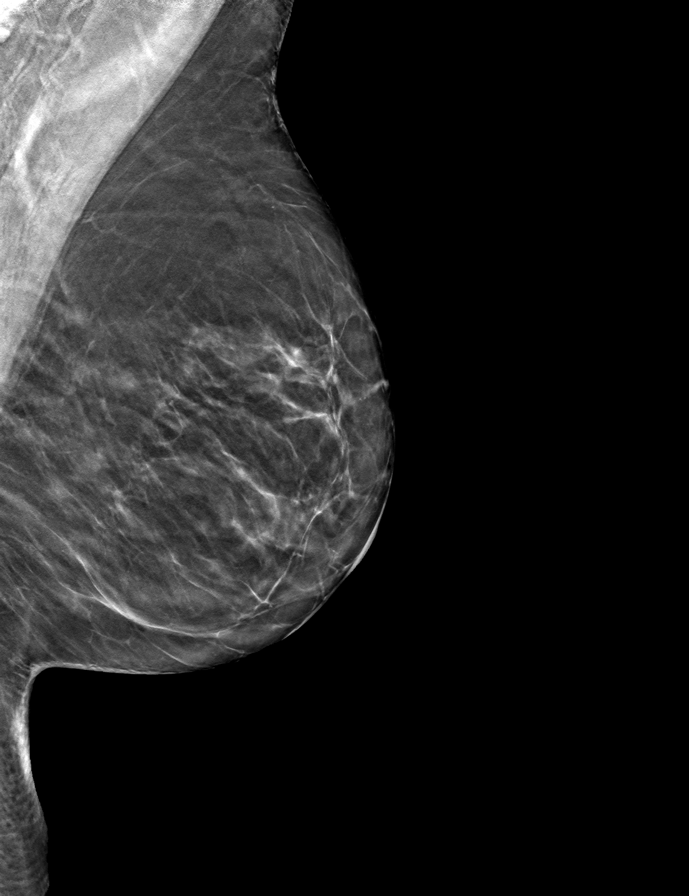

[9 of 24 positions shown; findings below may reference images not displayed]

ACR Breast Density Category b: There are scattered areas of
fibroglandular density.
FINDINGS: There are no findings suspicious for malignancy. Images were
processed with CAD.
IMPRESSION: No mammographic evidence of malignancy. A result letter of this
screening mammogram will be mailed directly to the patient.

RECOMMENDATION:
Screening mammogram in one year. (Code:CN-[id number])

BI-RADS CATEGORY  1: Negative.

## 2019-08-14 DIAGNOSIS — M7062 Trochanteric bursitis, left hip: Secondary | ICD-10-CM | POA: Diagnosis not present

## 2019-08-14 DIAGNOSIS — M25552 Pain in left hip: Secondary | ICD-10-CM | POA: Diagnosis not present

## 2019-08-15 DIAGNOSIS — M25552 Pain in left hip: Secondary | ICD-10-CM | POA: Diagnosis not present

## 2019-08-17 IMAGING — CT CT CTA ABD/PEL W/CM AND/OR W/O CM
2 of 7 series · 14 of 46 positions shown, 16 images · IV contrast (iopamidol)
Comparison: Prior CTA abdomen and pelvis 08/29/2016 and 07/29/2015

CLINICAL DATA: 72-year-old female with a history of splenic artery
aneurysm

EXAM:
CT ANGIOGRAPHY ABDOMEN AND PELVIS WITH CONTRAST AND WITHOUT CONTRAST
TECHNIQUE: Multidetector CT imaging of the abdomen and pelvis was performed
using the standard protocol during bolus administration of
intravenous contrast. Multiplanar reconstructed images and MIPs were
obtained and reviewed to evaluate the vascular anatomy.
Creatinine was obtained on site at [HOSPITAL] at [REDACTED].
Results: Creatinine 0.7 mg/dL.
CONTRAST:  75mL P1ZVUZ-P5F IOPAMIDOL (P1ZVUZ-P5F) INJECTION 76%

[Series 5: cta arterial 2.00 bv36 s3 axial st · axial · arterial · 0.72mm/px · z∈[+1200,+1556]mm · 11 of 196 slices shown, 13 images]
[im 9/196  soft-tissue]
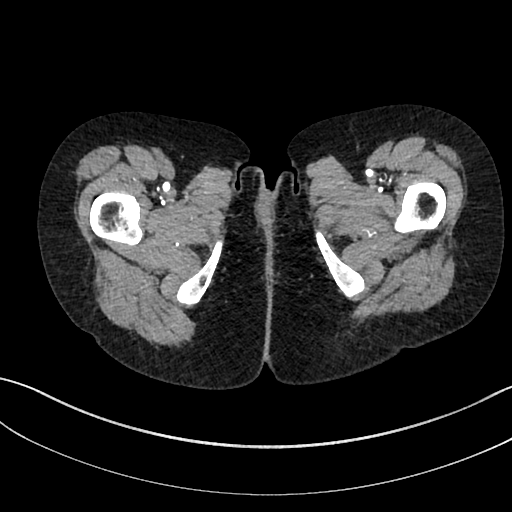
[im 9/196  bone]
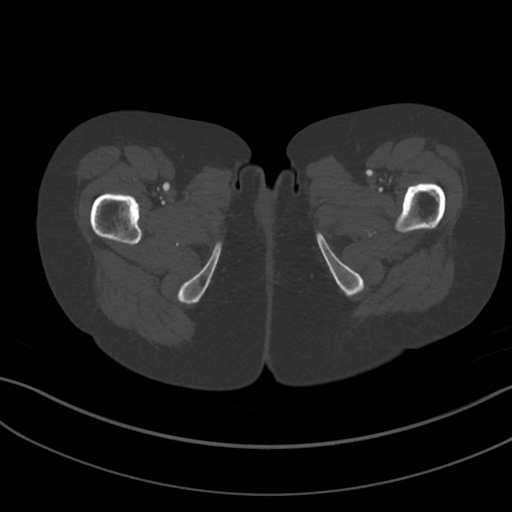
[im 27/196  soft-tissue]
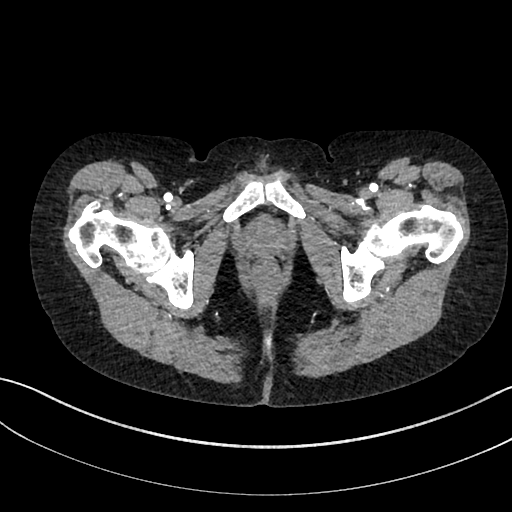
[im 45/196  soft-tissue]
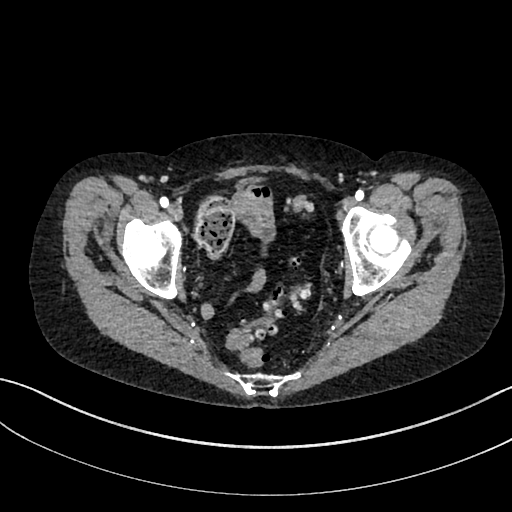
[im 63/196  soft-tissue]
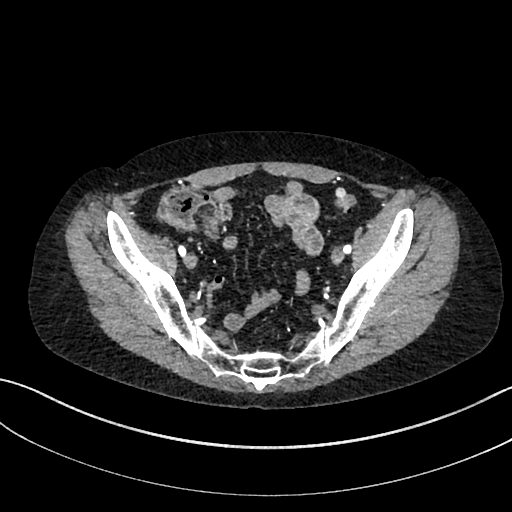
[im 80/196  soft-tissue]
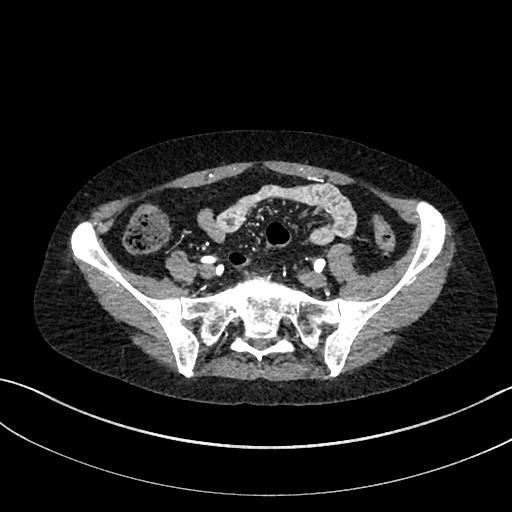
[im 98/196  soft-tissue]
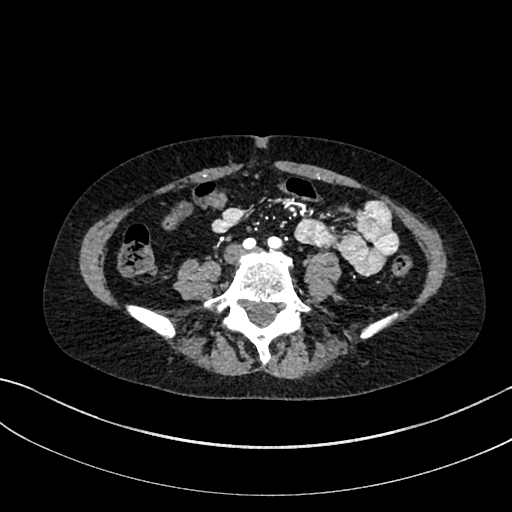
[im 116/196  soft-tissue]
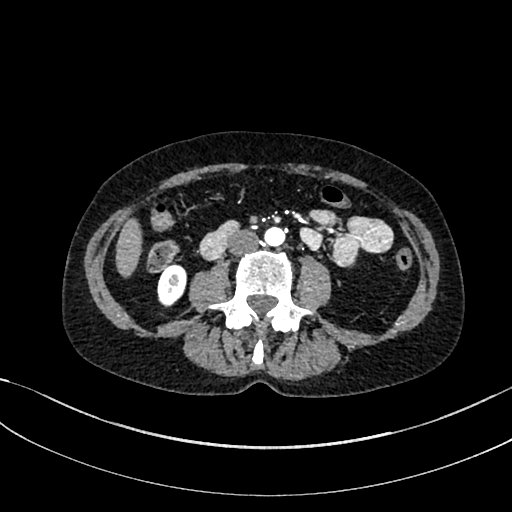
[im 133/196  soft-tissue]
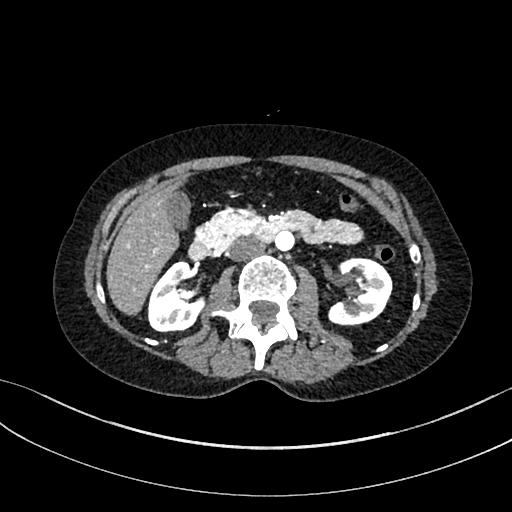
[im 151/196  soft-tissue]
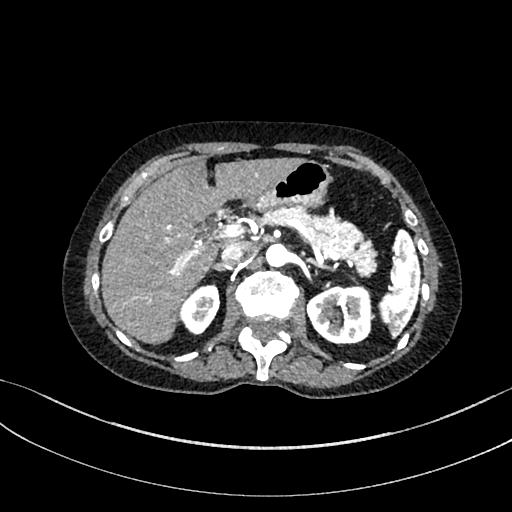
[im 151/196  bone]
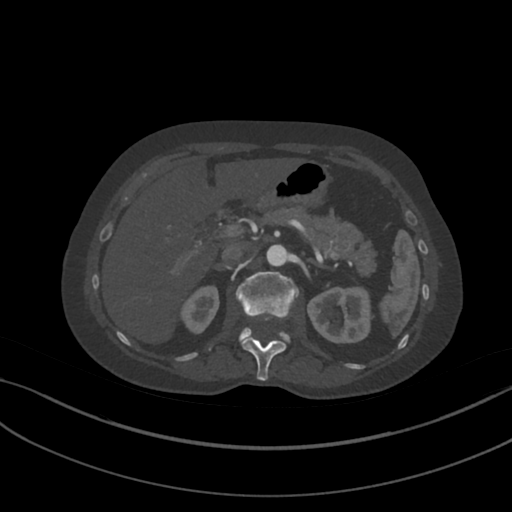
[im 169/196  soft-tissue]
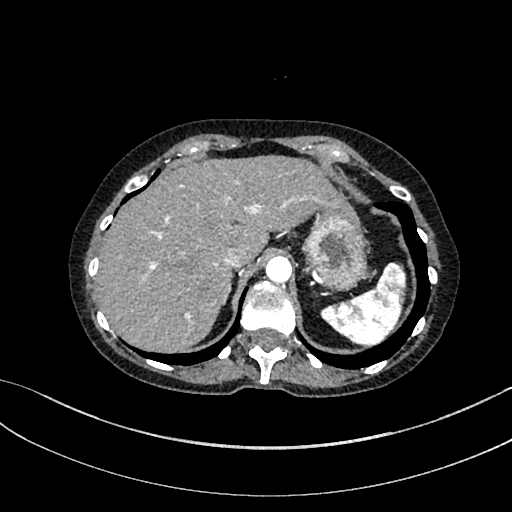
[im 187/196  soft-tissue]
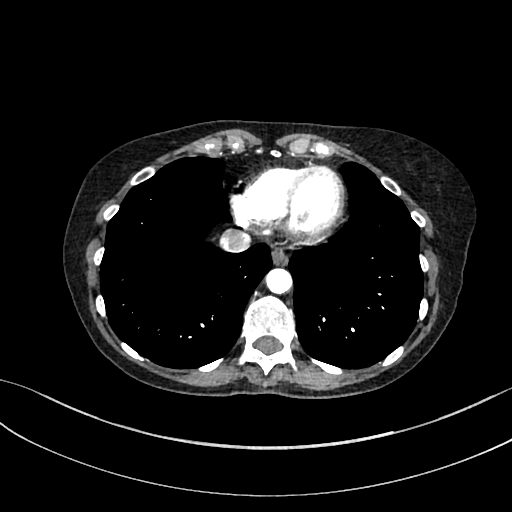

[Series 8: cta arterial 2.00 bv36 s3 cor cor art st · coronal · arterial · 0.62mm/px · 3 of 115 slices shown]
[im 29/115  soft-tissue]
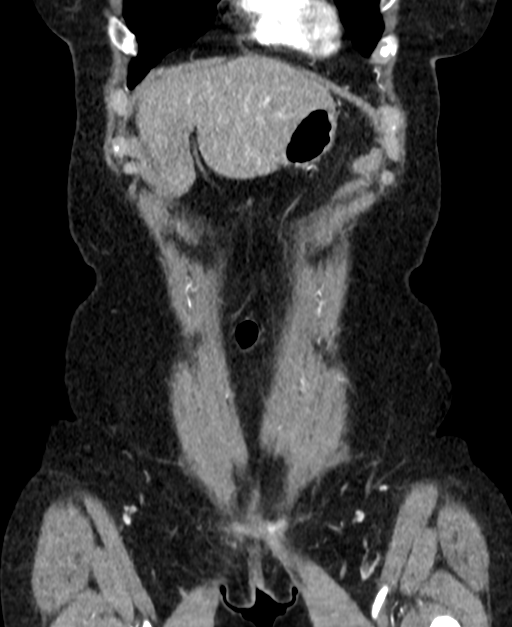
[im 58/115  soft-tissue]
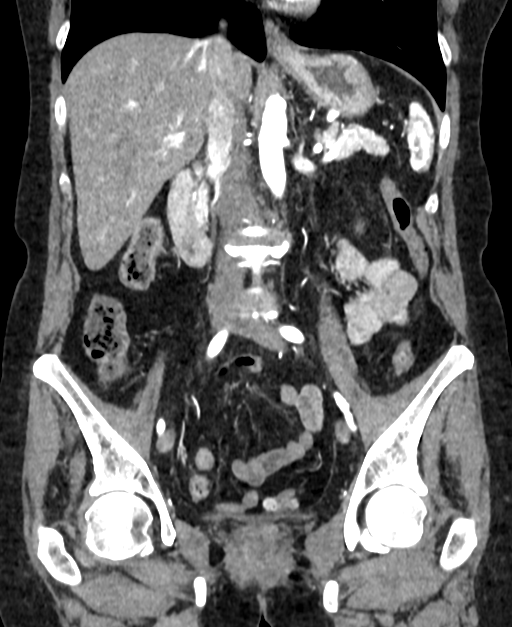
[im 86/115  soft-tissue]
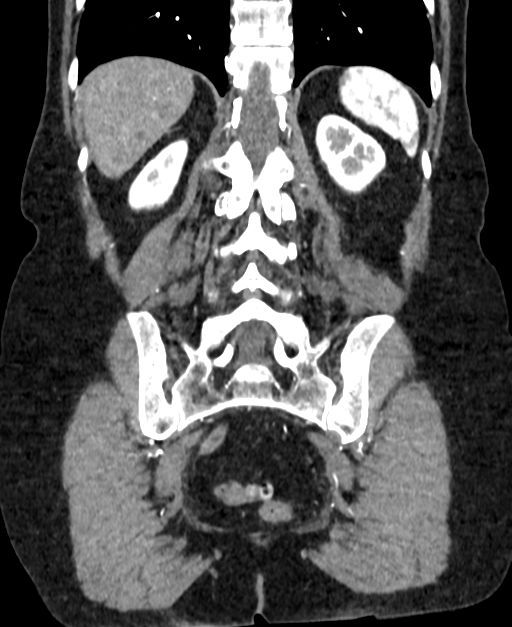

[14 of 46 positions shown; findings below may reference images not displayed]

FINDINGS: VASCULAR

Aorta: Normal caliber aorta without aneurysm, dissection, vasculitis
or significant stenosis.

Celiac: Continued stability of a bilobed partially peripherally
calcified aneurysm arising from the mid aspect of the splenic
artery. The aneurysm is best visualized on the coronal reformatted
images. Measured in similar fashion to the prior study, the larger
portion of the aneurysm measures up to 1.3 cm while the smaller
measures up to 1.0 cm. Measuring the lobes of the aneurysm from the
dome to the native arterial wall yields measurements of 1.8 and
cm respectively, which are also unchanged.

SMA: Patent without evidence of aneurysm, dissection, vasculitis or
significant stenosis. Small accessory right hepatic artery.

Renals: 2 right-sided and single left-sided renal artery. No
evidence of fibromuscular dysplasia, aneurysm or dissection.

IMA: Patent without evidence of aneurysm, dissection, vasculitis or
significant stenosis.

Inflow: Patent without evidence of aneurysm, dissection, vasculitis
or significant stenosis.

Proximal Outflow: Bilateral common femoral and visualized portions
of the superficial and profunda femoral arteries are patent without
evidence of aneurysm, dissection, vasculitis or significant
stenosis.

Veins: No obvious venous abnormality within the limitations of this
arterial phase study.

Review of the MIP images confirms the above findings.

NON-VASCULAR

Lower chest: The lung bases are clear. Visualized cardiac structures
are within normal limits for size. No pericardial effusion.
Unremarkable visualized distal thoracic esophagus.

Hepatobiliary: Normal hepatic contour and morphology. No discrete
hepatic lesions. Normal appearance of the gallbladder. No intra or
extrahepatic biliary ductal dilatation.

Pancreas: Unremarkable. No pancreatic ductal dilatation or
surrounding inflammatory changes.

Spleen: Normal in size without focal abnormality.

Adrenals/Urinary Tract: Normal adrenal glands. No evidence of
hydronephrosis or nephrolithiasis. Parapelvic renal sinus cysts are
noted incidentally on the left. The ureters and bladder are
unremarkable although the bladder is decompressed.

Stomach/Bowel: Colonic diverticular disease without CT evidence of
active inflammation. No focal bowel wall thickening or evidence of
obstruction.

Lymphatic: No suspicious lymphadenopathy.

Reproductive: Status post hysterectomy. No adnexal masses.

Other: No evidence of ascites.  No abdominal wall hernia.

Musculoskeletal: No acute fracture or aggressive appearing lytic or
blastic osseous lesion. Focal L5-S1 degenerative disc disease.
IMPRESSION: VASCULAR

1. Stable size and appearance of the bilobed saccular splenic artery
aneurysm compared to both 08/29/2016 and 07/29/2015. Maximal size by
my measurements, 1.8 cm.

NON-VASCULAR

1. No acute abnormality within the abdomen or pelvis.
2. Colonic diverticular disease without CT evidence of active
inflammation.
3. Focal L5-S1 degenerative disc disease.
4. Left renal parapelvic sinus cysts.

## 2019-08-26 ENCOUNTER — Ambulatory Visit: Payer: Medicare HMO

## 2019-08-26 DIAGNOSIS — M7062 Trochanteric bursitis, left hip: Secondary | ICD-10-CM | POA: Diagnosis not present

## 2019-08-26 DIAGNOSIS — M25552 Pain in left hip: Secondary | ICD-10-CM | POA: Diagnosis not present

## 2019-09-02 DIAGNOSIS — M25552 Pain in left hip: Secondary | ICD-10-CM | POA: Diagnosis not present

## 2019-09-02 DIAGNOSIS — M7062 Trochanteric bursitis, left hip: Secondary | ICD-10-CM | POA: Diagnosis not present

## 2019-09-16 DIAGNOSIS — M25552 Pain in left hip: Secondary | ICD-10-CM | POA: Diagnosis not present

## 2019-09-17 DIAGNOSIS — M25552 Pain in left hip: Secondary | ICD-10-CM | POA: Diagnosis not present

## 2019-09-29 DIAGNOSIS — M25552 Pain in left hip: Secondary | ICD-10-CM | POA: Diagnosis not present

## 2019-10-07 ENCOUNTER — Ambulatory Visit: Payer: Medicare HMO

## 2019-10-07 DIAGNOSIS — Z01419 Encounter for gynecological examination (general) (routine) without abnormal findings: Secondary | ICD-10-CM | POA: Diagnosis not present

## 2019-10-07 DIAGNOSIS — M85852 Other specified disorders of bone density and structure, left thigh: Secondary | ICD-10-CM | POA: Diagnosis not present

## 2019-10-07 DIAGNOSIS — Z23 Encounter for immunization: Secondary | ICD-10-CM | POA: Diagnosis not present

## 2019-10-07 DIAGNOSIS — G479 Sleep disorder, unspecified: Secondary | ICD-10-CM | POA: Diagnosis not present

## 2019-10-07 DIAGNOSIS — E559 Vitamin D deficiency, unspecified: Secondary | ICD-10-CM | POA: Diagnosis not present

## 2019-10-07 DIAGNOSIS — Z1389 Encounter for screening for other disorder: Secondary | ICD-10-CM | POA: Diagnosis not present

## 2019-10-07 DIAGNOSIS — H02409 Unspecified ptosis of unspecified eyelid: Secondary | ICD-10-CM | POA: Diagnosis not present

## 2019-10-07 DIAGNOSIS — M25552 Pain in left hip: Secondary | ICD-10-CM | POA: Diagnosis not present

## 2019-10-07 DIAGNOSIS — Z0000 Encounter for general adult medical examination without abnormal findings: Secondary | ICD-10-CM | POA: Diagnosis not present

## 2019-10-08 DIAGNOSIS — M25552 Pain in left hip: Secondary | ICD-10-CM | POA: Diagnosis not present

## 2019-10-10 ENCOUNTER — Other Ambulatory Visit: Payer: Self-pay | Admitting: Family Medicine

## 2019-10-10 DIAGNOSIS — M85852 Other specified disorders of bone density and structure, left thigh: Secondary | ICD-10-CM

## 2019-10-14 DIAGNOSIS — H2513 Age-related nuclear cataract, bilateral: Secondary | ICD-10-CM | POA: Diagnosis not present

## 2019-10-14 DIAGNOSIS — H40033 Anatomical narrow angle, bilateral: Secondary | ICD-10-CM | POA: Diagnosis not present

## 2019-10-14 DIAGNOSIS — H16223 Keratoconjunctivitis sicca, not specified as Sjogren's, bilateral: Secondary | ICD-10-CM | POA: Diagnosis not present

## 2019-10-14 DIAGNOSIS — H02831 Dermatochalasis of right upper eyelid: Secondary | ICD-10-CM | POA: Diagnosis not present

## 2019-10-14 DIAGNOSIS — H40053 Ocular hypertension, bilateral: Secondary | ICD-10-CM | POA: Diagnosis not present

## 2019-10-14 DIAGNOSIS — H43812 Vitreous degeneration, left eye: Secondary | ICD-10-CM | POA: Diagnosis not present

## 2019-10-14 DIAGNOSIS — H02834 Dermatochalasis of left upper eyelid: Secondary | ICD-10-CM | POA: Diagnosis not present

## 2019-10-20 ENCOUNTER — Ambulatory Visit: Payer: Medicare HMO

## 2019-11-03 DIAGNOSIS — F4323 Adjustment disorder with mixed anxiety and depressed mood: Secondary | ICD-10-CM | POA: Diagnosis not present

## 2019-11-06 DIAGNOSIS — F4323 Adjustment disorder with mixed anxiety and depressed mood: Secondary | ICD-10-CM | POA: Diagnosis not present

## 2019-11-10 DIAGNOSIS — F4323 Adjustment disorder with mixed anxiety and depressed mood: Secondary | ICD-10-CM | POA: Diagnosis not present

## 2019-11-13 DIAGNOSIS — F4323 Adjustment disorder with mixed anxiety and depressed mood: Secondary | ICD-10-CM | POA: Diagnosis not present

## 2019-11-17 DIAGNOSIS — F4323 Adjustment disorder with mixed anxiety and depressed mood: Secondary | ICD-10-CM | POA: Diagnosis not present

## 2019-11-19 DIAGNOSIS — M7062 Trochanteric bursitis, left hip: Secondary | ICD-10-CM | POA: Diagnosis not present

## 2019-11-21 DIAGNOSIS — F4323 Adjustment disorder with mixed anxiety and depressed mood: Secondary | ICD-10-CM | POA: Diagnosis not present

## 2019-11-24 DIAGNOSIS — F4323 Adjustment disorder with mixed anxiety and depressed mood: Secondary | ICD-10-CM | POA: Diagnosis not present

## 2019-11-27 DIAGNOSIS — F4323 Adjustment disorder with mixed anxiety and depressed mood: Secondary | ICD-10-CM | POA: Diagnosis not present

## 2019-12-01 DIAGNOSIS — F4323 Adjustment disorder with mixed anxiety and depressed mood: Secondary | ICD-10-CM | POA: Diagnosis not present

## 2019-12-15 DIAGNOSIS — F4323 Adjustment disorder with mixed anxiety and depressed mood: Secondary | ICD-10-CM | POA: Diagnosis not present

## 2019-12-24 DIAGNOSIS — F4323 Adjustment disorder with mixed anxiety and depressed mood: Secondary | ICD-10-CM | POA: Diagnosis not present

## 2019-12-29 DIAGNOSIS — F4323 Adjustment disorder with mixed anxiety and depressed mood: Secondary | ICD-10-CM | POA: Diagnosis not present

## 2020-01-05 DIAGNOSIS — F4323 Adjustment disorder with mixed anxiety and depressed mood: Secondary | ICD-10-CM | POA: Diagnosis not present

## 2020-01-27 ENCOUNTER — Other Ambulatory Visit: Payer: Self-pay | Admitting: Family Medicine

## 2020-01-27 DIAGNOSIS — Z1231 Encounter for screening mammogram for malignant neoplasm of breast: Secondary | ICD-10-CM

## 2020-01-28 ENCOUNTER — Ambulatory Visit: Payer: Medicare HMO

## 2020-01-28 ENCOUNTER — Other Ambulatory Visit: Payer: Medicare HMO

## 2020-01-30 DIAGNOSIS — M25552 Pain in left hip: Secondary | ICD-10-CM | POA: Diagnosis not present

## 2020-01-30 DIAGNOSIS — M7062 Trochanteric bursitis, left hip: Secondary | ICD-10-CM | POA: Diagnosis not present

## 2020-02-16 DIAGNOSIS — H2513 Age-related nuclear cataract, bilateral: Secondary | ICD-10-CM | POA: Diagnosis not present

## 2020-02-16 DIAGNOSIS — H16223 Keratoconjunctivitis sicca, not specified as Sjogren's, bilateral: Secondary | ICD-10-CM | POA: Diagnosis not present

## 2020-02-16 DIAGNOSIS — H40053 Ocular hypertension, bilateral: Secondary | ICD-10-CM | POA: Diagnosis not present

## 2020-02-16 DIAGNOSIS — H02834 Dermatochalasis of left upper eyelid: Secondary | ICD-10-CM | POA: Diagnosis not present

## 2020-02-16 DIAGNOSIS — H40033 Anatomical narrow angle, bilateral: Secondary | ICD-10-CM | POA: Diagnosis not present

## 2020-02-16 DIAGNOSIS — H02831 Dermatochalasis of right upper eyelid: Secondary | ICD-10-CM | POA: Diagnosis not present

## 2020-02-16 DIAGNOSIS — H43812 Vitreous degeneration, left eye: Secondary | ICD-10-CM | POA: Diagnosis not present

## 2020-05-04 DIAGNOSIS — L821 Other seborrheic keratosis: Secondary | ICD-10-CM | POA: Diagnosis not present

## 2020-05-04 DIAGNOSIS — D2271 Melanocytic nevi of right lower limb, including hip: Secondary | ICD-10-CM | POA: Diagnosis not present

## 2020-05-04 DIAGNOSIS — L814 Other melanin hyperpigmentation: Secondary | ICD-10-CM | POA: Diagnosis not present

## 2020-05-04 DIAGNOSIS — D2239 Melanocytic nevi of other parts of face: Secondary | ICD-10-CM | POA: Diagnosis not present

## 2020-05-04 DIAGNOSIS — L570 Actinic keratosis: Secondary | ICD-10-CM | POA: Diagnosis not present

## 2020-05-04 DIAGNOSIS — D1801 Hemangioma of skin and subcutaneous tissue: Secondary | ICD-10-CM | POA: Diagnosis not present

## 2020-05-04 DIAGNOSIS — D225 Melanocytic nevi of trunk: Secondary | ICD-10-CM | POA: Diagnosis not present

## 2020-05-04 DIAGNOSIS — D692 Other nonthrombocytopenic purpura: Secondary | ICD-10-CM | POA: Diagnosis not present

## 2020-05-17 ENCOUNTER — Ambulatory Visit: Payer: Medicare HMO

## 2020-05-17 ENCOUNTER — Other Ambulatory Visit: Payer: Medicare HMO

## 2020-09-07 DIAGNOSIS — H02834 Dermatochalasis of left upper eyelid: Secondary | ICD-10-CM | POA: Diagnosis not present

## 2020-09-07 DIAGNOSIS — H40053 Ocular hypertension, bilateral: Secondary | ICD-10-CM | POA: Diagnosis not present

## 2020-09-07 DIAGNOSIS — H02831 Dermatochalasis of right upper eyelid: Secondary | ICD-10-CM | POA: Diagnosis not present

## 2020-09-07 DIAGNOSIS — H2513 Age-related nuclear cataract, bilateral: Secondary | ICD-10-CM | POA: Diagnosis not present

## 2020-09-07 DIAGNOSIS — H16223 Keratoconjunctivitis sicca, not specified as Sjogren's, bilateral: Secondary | ICD-10-CM | POA: Diagnosis not present

## 2020-09-07 DIAGNOSIS — H43812 Vitreous degeneration, left eye: Secondary | ICD-10-CM | POA: Diagnosis not present

## 2020-09-07 DIAGNOSIS — H40033 Anatomical narrow angle, bilateral: Secondary | ICD-10-CM | POA: Diagnosis not present

## 2020-09-07 DIAGNOSIS — H5711 Ocular pain, right eye: Secondary | ICD-10-CM | POA: Diagnosis not present

## 2020-10-18 DIAGNOSIS — Z0000 Encounter for general adult medical examination without abnormal findings: Secondary | ICD-10-CM | POA: Diagnosis not present

## 2020-10-18 DIAGNOSIS — H02409 Unspecified ptosis of unspecified eyelid: Secondary | ICD-10-CM | POA: Diagnosis not present

## 2020-10-18 DIAGNOSIS — H409 Unspecified glaucoma: Secondary | ICD-10-CM | POA: Diagnosis not present

## 2020-10-18 DIAGNOSIS — E559 Vitamin D deficiency, unspecified: Secondary | ICD-10-CM | POA: Diagnosis not present

## 2020-10-18 DIAGNOSIS — G479 Sleep disorder, unspecified: Secondary | ICD-10-CM | POA: Diagnosis not present

## 2020-10-18 DIAGNOSIS — R634 Abnormal weight loss: Secondary | ICD-10-CM | POA: Diagnosis not present

## 2020-10-18 DIAGNOSIS — M85852 Other specified disorders of bone density and structure, left thigh: Secondary | ICD-10-CM | POA: Diagnosis not present

## 2020-10-18 DIAGNOSIS — Z23 Encounter for immunization: Secondary | ICD-10-CM | POA: Diagnosis not present

## 2020-10-18 DIAGNOSIS — Z1389 Encounter for screening for other disorder: Secondary | ICD-10-CM | POA: Diagnosis not present

## 2020-10-20 ENCOUNTER — Other Ambulatory Visit: Payer: Self-pay | Admitting: Family Medicine

## 2020-10-20 DIAGNOSIS — M8580 Other specified disorders of bone density and structure, unspecified site: Secondary | ICD-10-CM

## 2020-11-18 DIAGNOSIS — R0781 Pleurodynia: Secondary | ICD-10-CM | POA: Diagnosis not present

## 2020-11-18 DIAGNOSIS — I728 Aneurysm of other specified arteries: Secondary | ICD-10-CM | POA: Diagnosis not present

## 2020-11-19 ENCOUNTER — Other Ambulatory Visit: Payer: Self-pay | Admitting: Family Medicine

## 2020-11-19 DIAGNOSIS — I728 Aneurysm of other specified arteries: Secondary | ICD-10-CM

## 2020-11-29 ENCOUNTER — Ambulatory Visit
Admission: RE | Admit: 2020-11-29 | Discharge: 2020-11-29 | Disposition: A | Discharge status: Home / Self Care | Payer: Medicare HMO | Source: Ambulatory Visit | Attending: Family Medicine | Admitting: Family Medicine

## 2020-11-29 ENCOUNTER — Other Ambulatory Visit: Payer: Self-pay

## 2020-11-29 DIAGNOSIS — K8020 Calculus of gallbladder without cholecystitis without obstruction: Secondary | ICD-10-CM | POA: Diagnosis not present

## 2020-11-29 DIAGNOSIS — N281 Cyst of kidney, acquired: Secondary | ICD-10-CM | POA: Diagnosis not present

## 2020-11-29 DIAGNOSIS — I728 Aneurysm of other specified arteries: Secondary | ICD-10-CM

## 2020-11-29 DIAGNOSIS — K828 Other specified diseases of gallbladder: Secondary | ICD-10-CM | POA: Diagnosis not present

## 2020-11-29 MED ORDER — IOPAMIDOL (ISOVUE-370) INJECTION 76%
75.0000 mL | Freq: Once | INTRAVENOUS | Status: AC | PRN
  Administered 2020-11-29: 75 mL via INTRAVENOUS

## 2020-12-23 ENCOUNTER — Ambulatory Visit: Payer: Medicare HMO

## 2021-01-24 ENCOUNTER — Other Ambulatory Visit: Payer: Self-pay | Admitting: Family Medicine

## 2021-01-24 DIAGNOSIS — Z1231 Encounter for screening mammogram for malignant neoplasm of breast: Secondary | ICD-10-CM

## 2021-01-26 ENCOUNTER — Ambulatory Visit: Payer: Medicare HMO

## 2021-02-03 DIAGNOSIS — R109 Unspecified abdominal pain: Secondary | ICD-10-CM | POA: Diagnosis not present

## 2021-02-03 DIAGNOSIS — Z1211 Encounter for screening for malignant neoplasm of colon: Secondary | ICD-10-CM | POA: Diagnosis not present

## 2021-02-03 DIAGNOSIS — R1012 Left upper quadrant pain: Secondary | ICD-10-CM | POA: Diagnosis not present

## 2021-02-03 DIAGNOSIS — R634 Abnormal weight loss: Secondary | ICD-10-CM | POA: Diagnosis not present

## 2021-02-03 DIAGNOSIS — K869 Disease of pancreas, unspecified: Secondary | ICD-10-CM | POA: Diagnosis not present

## 2021-02-03 DIAGNOSIS — R6881 Early satiety: Secondary | ICD-10-CM | POA: Diagnosis not present

## 2021-02-03 DIAGNOSIS — R918 Other nonspecific abnormal finding of lung field: Secondary | ICD-10-CM | POA: Diagnosis not present

## 2021-02-04 ENCOUNTER — Ambulatory Visit
Admission: RE | Admit: 2021-02-04 | Discharge: 2021-02-04 | Disposition: A | Discharge status: Home / Self Care | Payer: Medicare HMO | Source: Ambulatory Visit | Attending: Physician Assistant | Admitting: Physician Assistant

## 2021-02-04 ENCOUNTER — Other Ambulatory Visit: Payer: Self-pay | Admitting: Physician Assistant

## 2021-02-04 DIAGNOSIS — R634 Abnormal weight loss: Secondary | ICD-10-CM

## 2021-02-04 DIAGNOSIS — K8689 Other specified diseases of pancreas: Secondary | ICD-10-CM

## 2021-02-04 DIAGNOSIS — R918 Other nonspecific abnormal finding of lung field: Secondary | ICD-10-CM

## 2021-02-04 DIAGNOSIS — R109 Unspecified abdominal pain: Secondary | ICD-10-CM

## 2021-02-09 ENCOUNTER — Ambulatory Visit: Payer: Medicare HMO

## 2021-02-10 DIAGNOSIS — R1012 Left upper quadrant pain: Secondary | ICD-10-CM | POA: Diagnosis not present

## 2021-02-15 DIAGNOSIS — R1013 Epigastric pain: Secondary | ICD-10-CM | POA: Diagnosis not present

## 2021-02-15 DIAGNOSIS — K2930 Chronic superficial gastritis without bleeding: Secondary | ICD-10-CM | POA: Diagnosis not present

## 2021-02-15 DIAGNOSIS — R634 Abnormal weight loss: Secondary | ICD-10-CM | POA: Diagnosis not present

## 2021-02-15 DIAGNOSIS — R6881 Early satiety: Secondary | ICD-10-CM | POA: Diagnosis not present

## 2021-02-18 ENCOUNTER — Other Ambulatory Visit: Payer: Self-pay

## 2021-02-18 ENCOUNTER — Ambulatory Visit
Admission: RE | Admit: 2021-02-18 | Discharge: 2021-02-18 | Disposition: A | Discharge status: Home / Self Care | Payer: Medicare HMO | Source: Ambulatory Visit | Attending: Physician Assistant | Admitting: Physician Assistant

## 2021-02-18 DIAGNOSIS — K760 Fatty (change of) liver, not elsewhere classified: Secondary | ICD-10-CM | POA: Diagnosis not present

## 2021-02-18 DIAGNOSIS — K8689 Other specified diseases of pancreas: Secondary | ICD-10-CM

## 2021-02-18 DIAGNOSIS — R634 Abnormal weight loss: Secondary | ICD-10-CM

## 2021-02-18 MED ORDER — GADOBENATE DIMEGLUMINE 529 MG/ML IV SOLN
7.0000 mL | Freq: Once | INTRAVENOUS | Status: AC | PRN
  Administered 2021-02-18: 7 mL via INTRAVENOUS

## 2021-02-21 DIAGNOSIS — K2930 Chronic superficial gastritis without bleeding: Secondary | ICD-10-CM | POA: Diagnosis not present

## 2021-02-24 ENCOUNTER — Other Ambulatory Visit: Payer: Self-pay | Admitting: Gastroenterology

## 2021-02-24 DIAGNOSIS — R1013 Epigastric pain: Secondary | ICD-10-CM

## 2021-03-02 DIAGNOSIS — R1013 Epigastric pain: Secondary | ICD-10-CM | POA: Diagnosis not present

## 2021-03-09 ENCOUNTER — Other Ambulatory Visit: Payer: Medicare HMO

## 2021-03-10 ENCOUNTER — Ambulatory Visit
Admission: RE | Admit: 2021-03-10 | Discharge: 2021-03-10 | Disposition: A | Discharge status: Home / Self Care | Payer: Medicare HMO | Source: Ambulatory Visit | Attending: Gastroenterology | Admitting: Gastroenterology

## 2021-03-10 DIAGNOSIS — R1013 Epigastric pain: Secondary | ICD-10-CM | POA: Diagnosis not present

## 2021-03-22 DIAGNOSIS — K146 Glossodynia: Secondary | ICD-10-CM | POA: Diagnosis not present

## 2021-03-22 DIAGNOSIS — K2970 Gastritis, unspecified, without bleeding: Secondary | ICD-10-CM | POA: Diagnosis not present

## 2021-03-22 DIAGNOSIS — E559 Vitamin D deficiency, unspecified: Secondary | ICD-10-CM | POA: Diagnosis not present

## 2021-03-23 ENCOUNTER — Other Ambulatory Visit: Payer: Self-pay

## 2021-03-23 ENCOUNTER — Ambulatory Visit
Admission: RE | Admit: 2021-03-23 | Discharge: 2021-03-23 | Disposition: A | Discharge status: Home / Self Care | Payer: Medicare HMO | Source: Ambulatory Visit | Attending: Family Medicine | Admitting: Family Medicine

## 2021-03-23 DIAGNOSIS — Z1231 Encounter for screening mammogram for malignant neoplasm of breast: Secondary | ICD-10-CM | POA: Diagnosis not present

## 2021-04-19 ENCOUNTER — Ambulatory Visit
Admission: RE | Admit: 2021-04-19 | Discharge: 2021-04-19 | Disposition: A | Discharge status: Home / Self Care | Payer: Medicare HMO | Source: Ambulatory Visit | Attending: Family Medicine | Admitting: Family Medicine

## 2021-04-19 DIAGNOSIS — M85852 Other specified disorders of bone density and structure, left thigh: Secondary | ICD-10-CM | POA: Diagnosis not present

## 2021-04-19 DIAGNOSIS — M8580 Other specified disorders of bone density and structure, unspecified site: Secondary | ICD-10-CM

## 2021-04-19 DIAGNOSIS — M810 Age-related osteoporosis without current pathological fracture: Secondary | ICD-10-CM | POA: Diagnosis not present

## 2021-04-19 DIAGNOSIS — Z780 Asymptomatic menopausal state: Secondary | ICD-10-CM | POA: Diagnosis not present

## 2021-05-23 DIAGNOSIS — E559 Vitamin D deficiency, unspecified: Secondary | ICD-10-CM | POA: Diagnosis not present

## 2021-05-23 DIAGNOSIS — M810 Age-related osteoporosis without current pathological fracture: Secondary | ICD-10-CM | POA: Diagnosis not present

## 2021-08-01 DIAGNOSIS — H6123 Impacted cerumen, bilateral: Secondary | ICD-10-CM | POA: Diagnosis not present

## 2021-08-10 DIAGNOSIS — H40033 Anatomical narrow angle, bilateral: Secondary | ICD-10-CM | POA: Diagnosis not present

## 2021-08-10 DIAGNOSIS — H2513 Age-related nuclear cataract, bilateral: Secondary | ICD-10-CM | POA: Diagnosis not present

## 2021-08-10 DIAGNOSIS — H02834 Dermatochalasis of left upper eyelid: Secondary | ICD-10-CM | POA: Diagnosis not present

## 2021-08-10 DIAGNOSIS — H40053 Ocular hypertension, bilateral: Secondary | ICD-10-CM | POA: Diagnosis not present

## 2021-08-10 DIAGNOSIS — H02831 Dermatochalasis of right upper eyelid: Secondary | ICD-10-CM | POA: Diagnosis not present

## 2021-08-10 DIAGNOSIS — H43812 Vitreous degeneration, left eye: Secondary | ICD-10-CM | POA: Diagnosis not present

## 2021-09-23 DIAGNOSIS — H2513 Age-related nuclear cataract, bilateral: Secondary | ICD-10-CM | POA: Diagnosis not present

## 2021-09-23 DIAGNOSIS — H40053 Ocular hypertension, bilateral: Secondary | ICD-10-CM | POA: Diagnosis not present

## 2021-09-23 DIAGNOSIS — H40033 Anatomical narrow angle, bilateral: Secondary | ICD-10-CM | POA: Diagnosis not present

## 2021-10-20 DIAGNOSIS — Z1331 Encounter for screening for depression: Secondary | ICD-10-CM | POA: Diagnosis not present

## 2021-10-20 DIAGNOSIS — R7309 Other abnormal glucose: Secondary | ICD-10-CM | POA: Diagnosis not present

## 2021-10-20 DIAGNOSIS — E538 Deficiency of other specified B group vitamins: Secondary | ICD-10-CM | POA: Diagnosis not present

## 2021-10-20 DIAGNOSIS — G479 Sleep disorder, unspecified: Secondary | ICD-10-CM | POA: Diagnosis not present

## 2021-10-20 DIAGNOSIS — K2970 Gastritis, unspecified, without bleeding: Secondary | ICD-10-CM | POA: Diagnosis not present

## 2021-10-20 DIAGNOSIS — Z23 Encounter for immunization: Secondary | ICD-10-CM | POA: Diagnosis not present

## 2021-10-20 DIAGNOSIS — Z01419 Encounter for gynecological examination (general) (routine) without abnormal findings: Secondary | ICD-10-CM | POA: Diagnosis not present

## 2021-10-20 DIAGNOSIS — E559 Vitamin D deficiency, unspecified: Secondary | ICD-10-CM | POA: Diagnosis not present

## 2021-10-20 DIAGNOSIS — Z0000 Encounter for general adult medical examination without abnormal findings: Secondary | ICD-10-CM | POA: Diagnosis not present

## 2021-10-20 DIAGNOSIS — R7301 Impaired fasting glucose: Secondary | ICD-10-CM | POA: Diagnosis not present

## 2021-10-20 DIAGNOSIS — M810 Age-related osteoporosis without current pathological fracture: Secondary | ICD-10-CM | POA: Diagnosis not present

## 2021-10-26 DIAGNOSIS — H5203 Hypermetropia, bilateral: Secondary | ICD-10-CM | POA: Diagnosis not present

## 2021-10-26 DIAGNOSIS — E538 Deficiency of other specified B group vitamins: Secondary | ICD-10-CM | POA: Diagnosis not present

## 2021-11-03 DIAGNOSIS — E538 Deficiency of other specified B group vitamins: Secondary | ICD-10-CM | POA: Diagnosis not present

## 2021-11-08 DIAGNOSIS — H02831 Dermatochalasis of right upper eyelid: Secondary | ICD-10-CM | POA: Diagnosis not present

## 2021-11-08 DIAGNOSIS — H2513 Age-related nuclear cataract, bilateral: Secondary | ICD-10-CM | POA: Diagnosis not present

## 2021-11-08 DIAGNOSIS — Z0100 Encounter for examination of eyes and vision without abnormal findings: Secondary | ICD-10-CM | POA: Diagnosis not present

## 2021-11-08 DIAGNOSIS — H02834 Dermatochalasis of left upper eyelid: Secondary | ICD-10-CM | POA: Diagnosis not present

## 2021-11-08 DIAGNOSIS — H40033 Anatomical narrow angle, bilateral: Secondary | ICD-10-CM | POA: Diagnosis not present

## 2021-11-08 DIAGNOSIS — H43812 Vitreous degeneration, left eye: Secondary | ICD-10-CM | POA: Diagnosis not present

## 2021-11-08 DIAGNOSIS — H40053 Ocular hypertension, bilateral: Secondary | ICD-10-CM | POA: Diagnosis not present

## 2021-11-10 DIAGNOSIS — E538 Deficiency of other specified B group vitamins: Secondary | ICD-10-CM | POA: Diagnosis not present

## 2021-11-16 DIAGNOSIS — E538 Deficiency of other specified B group vitamins: Secondary | ICD-10-CM | POA: Diagnosis not present

## 2021-11-23 DIAGNOSIS — E538 Deficiency of other specified B group vitamins: Secondary | ICD-10-CM | POA: Diagnosis not present

## 2021-12-19 DIAGNOSIS — R509 Fever, unspecified: Secondary | ICD-10-CM | POA: Diagnosis not present

## 2021-12-19 DIAGNOSIS — J069 Acute upper respiratory infection, unspecified: Secondary | ICD-10-CM | POA: Diagnosis not present

## 2021-12-19 DIAGNOSIS — Z03818 Encounter for observation for suspected exposure to other biological agents ruled out: Secondary | ICD-10-CM | POA: Diagnosis not present

## 2021-12-19 DIAGNOSIS — Z681 Body mass index (BMI) 19 or less, adult: Secondary | ICD-10-CM | POA: Diagnosis not present

## 2021-12-19 DIAGNOSIS — R051 Acute cough: Secondary | ICD-10-CM | POA: Diagnosis not present

## 2021-12-28 ENCOUNTER — Other Ambulatory Visit: Payer: Self-pay | Admitting: Family Medicine

## 2021-12-28 ENCOUNTER — Ambulatory Visit
Admission: RE | Admit: 2021-12-28 | Discharge: 2021-12-28 | Disposition: A | Discharge status: Home / Self Care | Payer: Medicare HMO | Source: Ambulatory Visit | Attending: Family Medicine | Admitting: Family Medicine

## 2021-12-28 DIAGNOSIS — Z03818 Encounter for observation for suspected exposure to other biological agents ruled out: Secondary | ICD-10-CM | POA: Diagnosis not present

## 2021-12-28 DIAGNOSIS — R059 Cough, unspecified: Secondary | ICD-10-CM | POA: Diagnosis not present

## 2021-12-28 DIAGNOSIS — R051 Acute cough: Secondary | ICD-10-CM

## 2021-12-28 DIAGNOSIS — J069 Acute upper respiratory infection, unspecified: Secondary | ICD-10-CM | POA: Diagnosis not present

## 2021-12-28 DIAGNOSIS — R0602 Shortness of breath: Secondary | ICD-10-CM | POA: Diagnosis not present

## 2022-06-02 DIAGNOSIS — H02834 Dermatochalasis of left upper eyelid: Secondary | ICD-10-CM | POA: Diagnosis not present

## 2022-06-02 DIAGNOSIS — H43812 Vitreous degeneration, left eye: Secondary | ICD-10-CM | POA: Diagnosis not present

## 2022-06-02 DIAGNOSIS — H16223 Keratoconjunctivitis sicca, not specified as Sjogren's, bilateral: Secondary | ICD-10-CM | POA: Diagnosis not present

## 2022-06-02 DIAGNOSIS — H2513 Age-related nuclear cataract, bilateral: Secondary | ICD-10-CM | POA: Diagnosis not present

## 2022-06-02 DIAGNOSIS — H02831 Dermatochalasis of right upper eyelid: Secondary | ICD-10-CM | POA: Diagnosis not present

## 2022-06-02 DIAGNOSIS — H401131 Primary open-angle glaucoma, bilateral, mild stage: Secondary | ICD-10-CM | POA: Diagnosis not present

## 2022-06-02 DIAGNOSIS — H40033 Anatomical narrow angle, bilateral: Secondary | ICD-10-CM | POA: Diagnosis not present

## 2022-07-19 DIAGNOSIS — Z681 Body mass index (BMI) 19 or less, adult: Secondary | ICD-10-CM | POA: Diagnosis not present

## 2022-07-19 DIAGNOSIS — I728 Aneurysm of other specified arteries: Secondary | ICD-10-CM | POA: Diagnosis not present

## 2022-07-19 DIAGNOSIS — R519 Headache, unspecified: Secondary | ICD-10-CM | POA: Diagnosis not present

## 2022-07-21 ENCOUNTER — Other Ambulatory Visit: Payer: Self-pay | Admitting: Family Medicine

## 2022-07-21 DIAGNOSIS — R519 Headache, unspecified: Secondary | ICD-10-CM

## 2022-08-03 ENCOUNTER — Ambulatory Visit
Admission: RE | Admit: 2022-08-03 | Discharge: 2022-08-03 | Disposition: A | Discharge status: Home / Self Care | Payer: Medicare Other | Source: Ambulatory Visit | Attending: Family Medicine | Admitting: Family Medicine

## 2022-08-03 DIAGNOSIS — R42 Dizziness and giddiness: Secondary | ICD-10-CM | POA: Diagnosis not present

## 2022-08-03 DIAGNOSIS — R519 Headache, unspecified: Secondary | ICD-10-CM

## 2022-08-03 DIAGNOSIS — R2689 Other abnormalities of gait and mobility: Secondary | ICD-10-CM | POA: Diagnosis not present

## 2022-08-03 MED ORDER — GADOPICLENOL 0.5 MMOL/ML IV SOLN
5.0000 mL | Freq: Once | INTRAVENOUS | Status: AC | PRN
  Administered 2022-08-03: 5 mL via INTRAVENOUS

## 2022-08-15 DIAGNOSIS — R519 Headache, unspecified: Secondary | ICD-10-CM | POA: Diagnosis not present

## 2022-08-15 DIAGNOSIS — H2513 Age-related nuclear cataract, bilateral: Secondary | ICD-10-CM | POA: Diagnosis not present

## 2022-08-15 DIAGNOSIS — H02831 Dermatochalasis of right upper eyelid: Secondary | ICD-10-CM | POA: Diagnosis not present

## 2022-08-15 DIAGNOSIS — H401131 Primary open-angle glaucoma, bilateral, mild stage: Secondary | ICD-10-CM | POA: Diagnosis not present

## 2022-08-15 DIAGNOSIS — H02834 Dermatochalasis of left upper eyelid: Secondary | ICD-10-CM | POA: Diagnosis not present

## 2022-08-15 DIAGNOSIS — H40033 Anatomical narrow angle, bilateral: Secondary | ICD-10-CM | POA: Diagnosis not present

## 2022-08-15 DIAGNOSIS — H43812 Vitreous degeneration, left eye: Secondary | ICD-10-CM | POA: Diagnosis not present

## 2022-08-15 DIAGNOSIS — E538 Deficiency of other specified B group vitamins: Secondary | ICD-10-CM | POA: Diagnosis not present

## 2022-11-09 ENCOUNTER — Ambulatory Visit: Payer: Medicare Other | Admitting: Neurology

## 2022-11-09 ENCOUNTER — Encounter: Payer: Self-pay | Admitting: Neurology

## 2022-11-09 VITALS — BP 123/65 | HR 79 | Ht 63.0 in | Wt 106.0 lb

## 2022-11-09 DIAGNOSIS — R42 Dizziness and giddiness: Secondary | ICD-10-CM

## 2022-11-09 DIAGNOSIS — G250 Essential tremor: Secondary | ICD-10-CM

## 2022-11-09 DIAGNOSIS — R2689 Other abnormalities of gait and mobility: Secondary | ICD-10-CM

## 2022-11-09 DIAGNOSIS — R208 Other disturbances of skin sensation: Secondary | ICD-10-CM

## 2022-11-09 NOTE — Progress Notes (Signed)
[facility]  PATIENT: Carrie Moody DOB: 1945/05/30  REFERRING DOCTOR OR PCP:  Gweneth Dimitri, MD SOURCE:   _________________________________   HISTORICAL  CHIEF COMPLAINT:  Chief Complaint  Patient presents with   Room 10    Pt is here with her Husband. Pt states that her gait was pulling her to the left when she was walking. Pt states that she has more of a stumbling feeling. Pt states that's she feels like how you would feel getting off a ride and how you can't find your balance. Pt states that the feeling that she gets is more of a falling sensation. Pt states that she feels brain zaps that are like electrical in her brain that last a few seconds. Pt states that she no longer has the brain fog. Pt states that she has Fat    HISTORY OF PRESENT ILLNESS:  I had the pleasure of seeing your patient, Carrie Moody, at [facility] for neurologic consultation regarding her dizziness and gait disturbance.  She is a 77 year old woman who first noted balance issues since June 2024.   Initially, se noted dizziness and a feeling like she was being pulled to the left while walking.  She felt a zapping sensation in her left scalp.  Symptoms have improved but she continues to have issues with equilibrium.  She will intermittently feel that her balance is off.   Sometimes when she walks, she feels she cannot find her footing --- like she has in past when getting off a boat of an amusement park ride.   She has noted this a lot while in a large store with high ceilings.     She has no actual falls.   She continues to have the zapping sensations that go though her head.   In general symptoms are better since June/July.      She denies numbness or weakness in arms or legs.   No change in bladder.   No change in vision.      She notes that she bumps her head a lot, especially in her attic or kitchen.   No LOC and no concussive symptoms.  She has a splenic artery  aneurysm. From October 2023 to spring 2024, she lost a lot of weight and had GI symptoms.   She was on antibiotics for gastritis and diverticulosis but not on currently.     Imaging review: MRI of the brain 08/03/2022 was personally reviewed.  It is normal for age just showing mild chronic microvascular ischemic changes.  There were no acute findings.  REVIEW OF SYSTEMS: Constitutional: No fevers, chills, sweats, or change in appetite Eyes: No visual changes, double vision, eye pain.  Has glaucoma Ear, nose and throat: No hearing loss, ear pain, nasal congestion, sore throat Cardiovascular: No chest pain, palpitations Respiratory:  No shortness of breath at rest or with exertion.   No wheezes GastrointestinaI: as above.   Genitourinary:  No dysuria, urinary retention or frequency.  No nocturia. Musculoskeletal:  No neck pain, back pain Integumentary: No rash, pruritus, skin lesions Neurological: as above Psychiatric: No depression at this time.  No anxiety Endocrine: No palpitations, diaphoresis, change in appetite, change in weigh or increased thirst Hematologic/Lymphatic:  No anemia, purpura, petechiae. Allergic/Immunologic: No itchy/runny eyes, nasal congestion, recent allergic reactions, rashes  ALLERGIES: Allergies  Allergen Reactions   Codeine Other (See Comments)    Patient doesn't recall exact reaction; remembers being "wired" after taking it.    HOME  MEDICATIONS:  Current Outpatient Medications:    Brinzolamide-Brimonidine (SIMBRINZA) 1-0.2 % SUSP, Apply to eye., Disp: , Rfl:    LUMIGAN 0.01 % SOLN, Place 1 drop into both eyes at bedtime. , Disp: , Rfl:    timolol (BETIMOL) 0.5 % ophthalmic solution, 1 drop 2 (two) times daily., Disp: , Rfl:    zolpidem (AMBIEN) 5 MG tablet, TAKE 1 TABLET BY MOUTH EVERYDAY AT BEDTIME, Disp: , Rfl: 2  PAST MEDICAL HISTORY: Past Medical History:  Diagnosis Date   Glaucoma    Splenic artery aneurysm (HCC)     PAST SURGICAL  HISTORY: Past Surgical History:  Procedure Laterality Date   ABDOMINAL HYSTERECTOMY     EYE SURGERY     IRIDOTOMY / IRIDECTOMY  2017    FAMILY HISTORY: Family History  Problem Relation Age of Onset   AAA (abdominal aortic aneurysm) Father    Cancer Father        lung   Cataracts Mother    Cancer Brother        head/neck ca   Glaucoma Paternal Grandmother    Diabetes Paternal Grandmother    Glaucoma Paternal Grandfather     SOCIAL HISTORY: Social History   Socioeconomic History   Marital status: Married    Spouse name: Not on file   Number of children: Not on file   Years of education: Not on file   Highest education level: Not on file  Occupational History   Not on file  Tobacco Use   Smoking status: Never   Smokeless tobacco: Never  Vaping Use   Vaping status: Never Used  Substance and Sexual Activity   Alcohol use: Yes    Comment: occasional   Drug use: Not on file   Sexual activity: Not on file  Other Topics Concern   Not on file  Social History Narrative   Not on file   Social Determinants of Health   Financial Resource Strain: Not on file  Food Insecurity: Not on file  Transportation Needs: Not on file  Physical Activity: Not on file  Stress: Not on file  Social Connections: Not on file  Intimate Partner Violence: Not on file       PHYSICAL EXAM  Vitals:   11/09/22 1033  BP: 123/65  Pulse: 79  Weight: 106 lb (48.1 kg)  Height: 5\' 3"  (1.6 m)    Body mass index is 18.78 kg/m.   General: The patient is well-developed and well-nourished and in no acute distress  HEENT:  Head is [place]/AT.  Sclera are anicteric.    Neck: No carotid bruits are noted.  The neck is nontender.  Cardiovascular: The heart has a regular rate and rhythm with a normal S1 and S2. There were no murmurs, gallops or rubs.    Skin: Extremities are without rash or  edema.  Musculoskeletal:  Back is nontender  Neurologic Exam  Mental status: The patient is alert  and oriented x 3 at the time of the examination. The patient has apparent normal recent and remote memory, with an apparently normal attention span and concentration ability.   Speech is normal.  Cranial nerves: Extraocular movements are full. Pupils are equal, round, and reactive to light and accomodation.    Facial symmetry is present. There is good facial sensation to soft touch bilaterally.Facial strength is normal.  Trapezius and sternocleidomastoid strength is normal. No dysarthria is noted.  The tongue is midline, and the patient has symmetric elevation of the soft palate. No  obvious hearing deficits are noted.   Weber does not lateralize  Motor: 7 Hz low amplitude tremor noted in the left hand and demonstrated during writing.  Currently no tremor in the right hand.  Muscle bulk is normal.   Tone is normal.  No cogwheeling.  Strength is  5 / 5 in all 4 extremities.   Sensory: Sensory testing is intact to pinprick, soft touch and vibration sensation in all 4 extremities.  Coordination: Cerebellar testing reveals good finger-nose-finger and heel-to-shin bilaterally.  Gait and station: Station is normal.   Gait is normal for age. Tandem gait is mildly wide.  She does not have any retropulsion.  Romberg is negative.   Reflexes: Deep tendon reflexes are symmetric and normal bilaterally.   Plantar responses are flexor.    DIAGNOSTIC DATA (LABS, IMAGING, TESTING) - I reviewed patient records, labs, notes, testing and imaging myself where available.  Lab Results  Component Value Date   WBC 11.1 (H) 08/17/2015   HGB 13.6 08/17/2015   HCT 40.8 08/17/2015   MCV 88.7 08/17/2015   PLT 215 08/17/2015      Component Value Date/Time   NA 136 08/17/2015 1507   K 3.7 08/17/2015 1507   CL 104 08/17/2015 1507   CO2 23 08/17/2015 1507   GLUCOSE 133 (H) 08/17/2015 1507   BUN 8 08/17/2015 1507   CREATININE 0.73 08/17/2015 1507   CALCIUM 9.0 08/17/2015 1507   PROT 7.5 08/17/2015 1507   ALBUMIN  4.6 08/17/2015 1507   AST 38 08/17/2015 1507   ALT 32 08/17/2015 1507   ALKPHOS 65 08/17/2015 1507   BILITOT 1.1 08/17/2015 1507   GFRNONAA >60 08/17/2015 1507   GFRAA >60 08/17/2015 1507       ASSESSMENT AND PLAN  Dysequilibrium - Plan: PT vestibular rehab  Benign essential tremor  Balance disorder - Plan: PT vestibular rehab   In summary, Ms. Bataille is a 77 year old woman with disequilibrium, dysesthetic head pain and mild tremor.  I personally reviewed the MRI of the brain and it is normal for age.  Exam just showed mild reduced balance and a mild essential tremor in the hands, left greater than right.  There is no evidence of Parkinson disease.  I do not have an explanation for her vestibular symptoms.  I discussed that the normal MRI rules out many serious disease processes.  It is possible that one of her head injuries might have contributed to the symptoms..  Since the disequilibrium has persisted I will refer her to vestibular therapy, with the hope that therapy and the passage of time will improve her symptoms further.  I did note that her vitamin B12 was low normal and I recommended that she take 1000 mcg orally daily.  She reports that she will have another primary care visit in 5 to 6 weeks.  Consider retesting the B12 level.  We discussed that her benign essential tremor is very mild and I would not recommend treatment.  If this worsens significantly, a beta-blocker could be tried.  She will return to see me as needed if there are significantly new or worsening neurologic symptoms.  Thank you for asking me to see Ms. Paluck.  Please let me know if I can be of further assistance with her or other patients in the future.  Yalanda Soderman A. Epimenio Foot, MD, [facility] 11/09/2022, 10:55 AM Certified in Neurology, Clinical Neurophysiology, Sleep Medicine and Neuroimaging  [facility] [address] [place], [place] [address] [phone]

## 2022-12-22 DIAGNOSIS — H401131 Primary open-angle glaucoma, bilateral, mild stage: Secondary | ICD-10-CM | POA: Diagnosis not present

## 2022-12-22 DIAGNOSIS — H02831 Dermatochalasis of right upper eyelid: Secondary | ICD-10-CM | POA: Diagnosis not present

## 2022-12-22 DIAGNOSIS — H02834 Dermatochalasis of left upper eyelid: Secondary | ICD-10-CM | POA: Diagnosis not present

## 2022-12-22 DIAGNOSIS — H2513 Age-related nuclear cataract, bilateral: Secondary | ICD-10-CM | POA: Diagnosis not present

## 2022-12-22 DIAGNOSIS — H40033 Anatomical narrow angle, bilateral: Secondary | ICD-10-CM | POA: Diagnosis not present

## 2022-12-22 DIAGNOSIS — H43812 Vitreous degeneration, left eye: Secondary | ICD-10-CM | POA: Diagnosis not present

## 2023-01-19 ENCOUNTER — Other Ambulatory Visit: Payer: Self-pay | Admitting: Gastroenterology

## 2023-01-19 DIAGNOSIS — K869 Disease of pancreas, unspecified: Secondary | ICD-10-CM

## 2023-02-25 ENCOUNTER — Other Ambulatory Visit: Payer: Medicare Other

## 2023-02-26 DIAGNOSIS — H33322 Round hole, left eye: Secondary | ICD-10-CM | POA: Diagnosis not present

## 2023-03-26 DIAGNOSIS — K2970 Gastritis, unspecified, without bleeding: Secondary | ICD-10-CM | POA: Diagnosis not present

## 2023-03-26 DIAGNOSIS — Z0000 Encounter for general adult medical examination without abnormal findings: Secondary | ICD-10-CM | POA: Diagnosis not present

## 2023-03-26 DIAGNOSIS — E559 Vitamin D deficiency, unspecified: Secondary | ICD-10-CM | POA: Diagnosis not present

## 2023-03-26 DIAGNOSIS — Z13220 Encounter for screening for lipoid disorders: Secondary | ICD-10-CM | POA: Diagnosis not present

## 2023-03-26 DIAGNOSIS — G479 Sleep disorder, unspecified: Secondary | ICD-10-CM | POA: Diagnosis not present

## 2023-03-26 DIAGNOSIS — H409 Unspecified glaucoma: Secondary | ICD-10-CM | POA: Diagnosis not present

## 2023-03-26 DIAGNOSIS — M810 Age-related osteoporosis without current pathological fracture: Secondary | ICD-10-CM | POA: Diagnosis not present

## 2023-03-26 DIAGNOSIS — Z23 Encounter for immunization: Secondary | ICD-10-CM | POA: Diagnosis not present

## 2023-03-27 ENCOUNTER — Other Ambulatory Visit: Payer: Self-pay | Admitting: Family Medicine

## 2023-03-27 DIAGNOSIS — M810 Age-related osteoporosis without current pathological fracture: Secondary | ICD-10-CM

## 2023-04-04 ENCOUNTER — Other Ambulatory Visit: Payer: Medicare Other

## 2023-04-25 ENCOUNTER — Ambulatory Visit
Admission: RE | Admit: 2023-04-25 | Discharge: 2023-04-25 | Disposition: A | Discharge status: Home / Self Care | Source: Ambulatory Visit | Attending: Gastroenterology | Admitting: Gastroenterology

## 2023-04-25 DIAGNOSIS — K869 Disease of pancreas, unspecified: Secondary | ICD-10-CM

## 2023-04-25 DIAGNOSIS — I728 Aneurysm of other specified arteries: Secondary | ICD-10-CM | POA: Diagnosis not present

## 2023-04-25 DIAGNOSIS — K862 Cyst of pancreas: Secondary | ICD-10-CM | POA: Diagnosis not present

## 2023-04-25 MED ORDER — GADOPICLENOL 0.5 MMOL/ML IV SOLN
5.0000 mL | Freq: Once | INTRAVENOUS | Status: AC | PRN
  Administered 2023-04-25: 5 mL via INTRAVENOUS

## 2023-05-24 DIAGNOSIS — I10 Essential (primary) hypertension: Secondary | ICD-10-CM | POA: Diagnosis not present

## 2023-05-24 DIAGNOSIS — Z681 Body mass index (BMI) 19 or less, adult: Secondary | ICD-10-CM | POA: Diagnosis not present

## 2023-07-24 ENCOUNTER — Other Ambulatory Visit: Payer: Self-pay | Admitting: Family Medicine

## 2023-07-24 DIAGNOSIS — M810 Age-related osteoporosis without current pathological fracture: Secondary | ICD-10-CM

## 2023-07-26 DIAGNOSIS — I10 Essential (primary) hypertension: Secondary | ICD-10-CM | POA: Diagnosis not present

## 2023-07-26 DIAGNOSIS — Z681 Body mass index (BMI) 19 or less, adult: Secondary | ICD-10-CM | POA: Diagnosis not present

## 2023-07-26 DIAGNOSIS — Z79899 Other long term (current) drug therapy: Secondary | ICD-10-CM | POA: Diagnosis not present

## 2023-09-25 ENCOUNTER — Other Ambulatory Visit: Payer: Self-pay | Admitting: Family Medicine

## 2023-09-25 DIAGNOSIS — M810 Age-related osteoporosis without current pathological fracture: Secondary | ICD-10-CM

## 2024-01-25 ENCOUNTER — Other Ambulatory Visit (HOSPITAL_BASED_OUTPATIENT_CLINIC_OR_DEPARTMENT_OTHER)

## 2024-05-22 ENCOUNTER — Other Ambulatory Visit (HOSPITAL_BASED_OUTPATIENT_CLINIC_OR_DEPARTMENT_OTHER): Payer: Self-pay
